# Patient Record
Sex: Male | Born: 1990 | Race: Black or African American | Hispanic: No | Marital: Single | Smoking: Current every day smoker
Health system: Southern US, Community
[De-identification: ages and names within clinical notes are randomized; demographics above are authoritative.]

## PROBLEM LIST (undated history)

## (undated) DIAGNOSIS — M1990 Unspecified osteoarthritis, unspecified site: Secondary | ICD-10-CM

## (undated) DIAGNOSIS — K219 Gastro-esophageal reflux disease without esophagitis: Secondary | ICD-10-CM

## (undated) DIAGNOSIS — I10 Essential (primary) hypertension: Secondary | ICD-10-CM

---
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
## 2010-05-25 NOTE — Letter (Signed)
 Summary: Handout Printed  Printed Handout:  - High Blood Pressure (Hypertension)

## 2010-05-25 NOTE — Assessment & Plan Note (Signed)
 Summary: no show   Allergies: No Known Drug Allergies   Complete Medication List: 1)  Ketoconazole 200 Mg Tabs (Ketoconazole) .SABRA.. 1 tab by mouth daily for 5 days 2)  Ketoconazole 200 Mg Tabs (Ketoconazole) .... 2 tabs by mouth one time

## 2010-05-25 NOTE — Assessment & Plan Note (Signed)
 Summary: penile retraction syndrome,tcb   Vital Signs:  Patient profile:   20 year old male Height:      70 inches Weight:      292.38 pounds BMI:     42.10 Temp:     98.2 degrees F oral Pulse rate:   67 / minute BP sitting:   125 / 83  (left arm)  Vitals Entered By: Arland Morel, CMA (February 10, 2009 11:37 AM) CC: Penile retraction syndrome Is Patient Diabetic? No Pain Assessment Patient in pain? no        Primary Care Provider:  Debby Petties MD  CC:  Penile retraction syndrome.  History of Present Illness: 38y male with penile complaints.  Concerned that his penis is smaller than his brothers and father.  Worried about length.  Able to get normal erections, able to penetrate, uses condoms when having sex, has axillary, beard, and pubic hair, ejaculates normally.  No hx STDs, was tested for GC/Chlam, HIV, HSV, RPR almost a year ago by prior PCP.  Normal urinary stream, no dribbling.  Only complaint is some pulling sensation when erect. No discharge.  Pt's main concern is size of penis.    Habits & Providers  Alcohol-Tobacco-Diet     Tobacco Status: never  Allergies: No Known Drug Allergies  Past History:  Family History: Last updated: 07/07/2006 dad died from copd/osa?  Social History: Last updated: 04/09/2008 lives with mom and brother and sister.  Attends [employer]  Social History: Smoking Status:  never  Review of Systems       See HPI  Physical Exam  General:  Well-developed,well-nourished,in no acute distress; alert,appropriate and cooperative throughout examination Abdomen:  Bowel sounds positive,abdomen soft and non-tender without masses, organomegaly or hernias noted. Genitalia:  Testes bilaterally descended without nodularity, tenderness or masses. No scrotal masses or lesions. No penis lesions or urethral discharge.  Normal secondary sex characteristics, testes normal in size.  Penis 3.5 inches long from base to tip.  Circumsized.   Impression & Recommendations:  Problem # 1:  PENILE PAIN (ICD-607.89) Assessment New Checked first void GC/Chlam, clean catch UA.  UA negative, awaiting Gc/ Chlam.  Tylenol /ibuprofen  for pain.  Penis low-normal range for length but still normal. Prior STD w/u negative.  RTC one month.  Orders: Urinalysis-[facility] (00000) GC/Chlamydia-[facility] (87591/87491) [facility]- Est Level  3 (00786)  Complete Medication List: 1)  Ketoconazole 200 Mg Tabs (Ketoconazole) .SABRA.. 1 tab by mouth daily for 5 days 2)  Ketoconazole 200 Mg Tabs (Ketoconazole) .... 2 tabs by mouth one time  Patient Instructions: 1)  Good to see you, 2)  Your urine test was negative, we still need to wait for container number 2 to be checked at the main lab for GC/ Chlam.  3)  Everything else is normal.  Over the counter tylenol  or ibuprofen  can be used for any penile pain. 4)  Come back to see me in a month to continue preventive health care. 5)  -Dr. ONEIDA.  Laboratory Results   Urine Tests  Date/Time Received: February 10, 2009 12:07 PM  Date/Time Reported: February 10, 2009 12:31 PM   Routine Urinalysis   Color: yellow Appearance: Clear Glucose: negative   (Normal Range: Negative) Bilirubin: negative   (Normal Range: Negative) Ketone: negative   (Normal Range: Negative) Spec. Gravity: 1.020   (Normal Range: 1.003-1.035) Blood: negative   (Normal Range: Negative) pH: 6.5   (Normal Range: 5.0-8.0) Protein: negative   (Normal Range: Negative) Urobilinogen: 0.2   (Normal Range:  0-1) Nitrite: negative   (Normal Range: Negative) Leukocyte Esterace: negative   (Normal Range: Negative)    Comments: ...............test performed by......SABRABonnie A. Jordan, [place] (ASCP)

## 2010-05-25 NOTE — Assessment & Plan Note (Signed)
 Summary: f/u last visit/eo   Vital Signs:  Patient profile:   20 year old male Height:      70 inches Weight:      289.8 pounds BMI:     41.73 Temp:     98.3 degrees F oral Pulse rate:   72 / minute BP sitting:   144 / 85  (right arm) Cuff size:   large  Vitals Entered By: Olam Keeling (March 16, 2009 10:57 AM) CC: F/U apt Is Patient Diabetic? No Pain Assessment Patient in pain? no        Primary Care Provider:  Debby Petties MD  CC:  F/U apt.  History of Present Illness: 20y M here for fu  Penile pain:  Resolved, some stretching sensation when erect, has all secondary sex chars, GC/Chlam, UA neg at last visit.  HTN:  Elevated BP today.  No symptoms.   Skin rash:  states he usually breaks out about this time.  Previously rx'ed ketoconazole and this cleared up rash. KOH positive today.  Preventive care:  Due for Hep B and flu.  Habits & Providers  Alcohol-Tobacco-Diet     Tobacco Status: current     Tobacco Counseling: to quit use of tobacco products     Cigarette Packs/Day: <0.25     Other Tobacco cigar  Current Medications (verified): 1)  Ketoconazole 200 Mg Tabs (Ketoconazole) .SABRA.. 1 Tab By Mouth Daily For 5 Days 2)  Ketoconazole 200 Mg  Tabs (Ketoconazole) .... 2 Tabs By Mouth One Time  Allergies (verified): No Known Drug Allergies  Past History:  Past Medical History: Tinea Corporis  Social History: Smoking Status:  current Packs/Day:  <0.25  Review of Systems       See HPI  Physical Exam  General:  Well-developed,well-nourished,in no acute distress; alert,appropriate and cooperative throughout examination Lungs:  Normal respiratory effort, chest expands symmetrically. Lungs are clear to auscultation, no crackles or wheezes. Heart:  Normal rate and regular rhythm. S1 and S2 normal without gallop, murmur, click, rub or other extra sounds. Skin:  3 circular, scaly lesions present on left side of neck and left shoulder, approx 1cm in  diameter.   Impression & Recommendations:  Problem # 1:  TINEA CORPORIS (ICD-110.5) Assessment New Confirmed on KOH. Refilled ketoconazole.  Pt to RTC if rash flares. Orders: [facility]- Est  Level 4 (00785)  Problem # 2:  PENILE PAIN (ICD-607.89) Assessment: Improved Resolving.  Recommended moisturizers for penile skin.  Orders: [facility]- Est  Level 4 (00785)  Problem # 3:  HYPERTENSION, BENIGN SYSTEMIC (ICD-401.1) Assessment: Deteriorated Care plans printed out, BMET and Lipids.  Orders: [facility]- Est  Level 4 (00785)  Future Orders: Basic Met-[facility] ([id number]) ... 03/16/2010 Lipid-[facility] ([id number]) ... 03/16/2010  Complete Medication List: 1)  Ketoconazole 200 Mg Tabs (Ketoconazole) .SABRA.. 1 tab by mouth daily for 5 days 2)  Ketoconazole 200 Mg Tabs (Ketoconazole) .... 2 tabs by mouth one time  Other Orders: KOH-[facility] (12779)  Patient Instructions: 1)  Take the ketoconazole as prescribed, 2)  Make an appt at the front for a lab visit to check cholesterol and kidney function. 3)  Come back to see me in a year or earlier if needed or if the skin rash flares. 4)  -Dr. ONEIDA. Prescriptions: KETOCONAZOLE 200 MG TABS (KETOCONAZOLE) 1 tab by mouth daily for 5 days  #5 x 0   Entered and Authorized by:   Debby Petties MD   Signed by:   Debby Petties MD on 03/16/2009  Method used:   Print then Give to Patient   RxID:   [id number] KETOCONAZOLE 200 MG  TABS (KETOCONAZOLE) 2 tabs by mouth one time  #2 x 0   Entered and Authorized by:   Debby Petties MD   Signed by:   Debby Petties MD on 03/16/2009   Method used:   Print then Give to Patient   RxID:   [id number]   Flu Vaccine Result Date:  03/16/2009 Flu Vaccine Result:  given Flu Vaccine Next Due:  1 yr TD Next Due:  Not Indicated    Laboratory Results  Date/Time Received: March 16, 2009 11:14 AM  Date/Time Reported: March 16, 2009 11:19 AM   Other Tests  Skin KOH:  Positive Comments: ...............test performed by......SABRABonnie A. Jordan, MLS (ASCP)cm     Prevention & Chronic Care Immunizations   Influenza vaccine: given  (03/16/2009)   Influenza vaccine due: 03/16/2010    Tetanus booster: Not documented   Td booster deferral: Not indicated  (03/16/2009)   Tetanus booster due: Not Indicated    Pneumococcal vaccine: Not documented  Other Screening   Smoking status: current  (03/16/2009)   Smoking cessation counseling: yes  (03/16/2009)  Hypertension   Last Blood Pressure: 144 / 85  (03/16/2009)   Serum creatinine: Not documented   Serum potassium Not documented    Hypertension flowsheet reviewed?: Yes   Progress toward BP goal: Deteriorated  Self-Management Support :   Personal Goals (by the next clinic visit) :      Personal blood pressure goal: 140/90  (03/16/2009)   Hypertension self-management support: Education handout, Pre-printed educational material, Written self-care plan  (03/16/2009)   Hypertension self-care plan printed.   Hypertension education handout printed   Nursing Instructions: Give Flu vaccine today     Appended Document: f/u last visit/eo Flu vaccine & Hept. B vaccine given. Entered in [place]

## 2010-06-09 ENCOUNTER — Ambulatory Visit: Payer: Self-pay | Admitting: Sports Medicine

## 2010-06-13 ENCOUNTER — Encounter: Payer: Self-pay | Admitting: *Deleted

## 2011-08-30 ENCOUNTER — Emergency Department (HOSPITAL_COMMUNITY)
Admission: EM | Admit: 2011-08-30 | Discharge: 2011-08-30 | Disposition: A | Discharge status: Home / Self Care | Payer: Self-pay | Attending: Emergency Medicine | Admitting: Emergency Medicine

## 2011-08-30 ENCOUNTER — Encounter (HOSPITAL_COMMUNITY): Payer: Self-pay | Admitting: *Deleted

## 2011-08-30 DIAGNOSIS — R51 Headache: Secondary | ICD-10-CM | POA: Insufficient documentation

## 2011-08-30 DIAGNOSIS — E119 Type 2 diabetes mellitus without complications: Secondary | ICD-10-CM | POA: Insufficient documentation

## 2011-08-30 NOTE — ED Provider Notes (Signed)
History  This chart was scribed for Dione Booze, MD by Bennett Scrape. This patient was seen in room STRE5/STRE5 and the patient's care was started at 7:09PM.  CSN: [id number]  Arrival date & time 08/30/11  1617   First MD Initiated Contact with Patient 08/30/11 1909      Chief Complaint  Patient presents with  . Facial Pain     The history is provided by the patient. No language interpreter was used.    Hayden Moore is a 21 y.o. male who presents to the Emergency Department complaining of gradual onset, gradually improving right-sided facial pain described as a pounding/throubbing sensation that started about 5 hours ago. He denies pain now. The pain is a 9 out of 10 at the worst. He reports taking two 800 mg ibuprofen pills with moderate improvement in pain. He denies injury as the cuase. He denies prior episodes. He denies any associated symptoms such as trouble breathing, HA or fever. He has a h/o diabetes.  No PCP.  Past Medical History  Diagnosis Date  . Diabetes mellitus     History reviewed. No pertinent past surgical history.  No family history on file.  History  Substance Use Topics  . Smoking status: Never Smoker   . Smokeless tobacco: Not on file  . Alcohol Use: Yes      Review of Systems  Constitutional: Negative for fever and chills.  HENT: Negative for congestion and trouble swallowing.        Right-sided facial pain  Respiratory: Negative for cough and shortness of breath.   Gastrointestinal: Negative for nausea and vomiting.  Neurological: Negative for weakness and headaches.    Allergies  Review of patient's allergies indicates no known allergies.  Home Medications  No current outpatient prescriptions on file.  Triage Vitals: BP 159/67  Pulse 57  Temp(Src) 98.4 F (36.9 C) (Oral)  Resp 20  SpO2 100%  Physical Exam  Nursing note and vitals reviewed. Constitutional: He is oriented to person, place, and time. He appears well-developed  and well-nourished. No distress.  HENT:  Head: Normocephalic and atraumatic.       TMs are clear bilaterally  Eyes: Conjunctivae and EOM are normal.  Neck: Neck supple. No tracheal deviation present.  Cardiovascular: Normal rate.   Pulmonary/Chest: Effort normal. No respiratory distress.  Musculoskeletal: Normal range of motion.  Lymphadenopathy:    He has no cervical adenopathy.  Neurological: He is alert and oriented to person, place, and time.  Skin: Skin is warm and dry.  Psychiatric: He has a normal mood and affect. His behavior is normal.    ED Course  Procedures (including critical care time)  DIAGNOSTIC STUDIES: Oxygen Saturation is 100% on room air, normal by my interpretation.    COORDINATION OF CARE: 7:13PM-Advised pt to not take 800 mg of ibuprofen at one time due to risk of ulcers.  7:15PM-Discussed discharge plans with pt and pt agreed. Will refer pt to neurologist to follow up with if the pain returns.    1. Right-sided face pain       MDM  Facial pain of uncertain etiology. Possible trigeminal neuralgia.  I personally performed the services described in this documentation, which was scribed in my presence. The recorded information has been reviewed and considered.          Dione Booze, MD 09/04/11 [phone]

## 2011-08-30 NOTE — Discharge Instructions (Signed)
Take ibuprofen or naproxen as needed for pain. If pain starts coming more frequently, followup with a neurologist for further evaluation. Return to the emergency department if you're having any problems.  Trigeminal Neuralgia Trigeminal neuralgia is a nerve disorder that causes sudden attacks of severe facial pain. It is caused by damage to the trigeminal nerve, a major nerve in the face. It is more common in women and in the elderly, although it can also happen in younger patients. Attacks last from a few seconds to several minutes and can occur from a couple of times per year to several times per day. Trigeminal neuralgia can be a very distressing and disabling condition. Surgery may be needed in very severe cases if medical treatment does not give relief. HOME CARE INSTRUCTIONS   If your caregiver prescribed medication to help prevent attacks, take as directed.   To help prevent attacks:   Chew on the unaffected side of the mouth.   Avoid touching your face.   Avoid blasts of hot or cold air.   Men may wish to grow a beard to avoid having to shave.  SEEK IMMEDIATE MEDICAL CARE IF:  Pain is unbearable and your medicine does not help.   You develop new, unexplained symptoms (problems).   You have problems that may be related to a medication you are taking.  Document Released: 04/08/2000 Document Revised: 03/31/2011 Document Reviewed: 02/06/2009 [facility] Patient Information 2012 [place], [place].

## 2011-08-30 NOTE — ED Notes (Signed)
The pt has had rt face  Head pain for 2hours while watching tv.  No known injury, no previous history.  He took ibu and the pain is half what it was

## 2013-01-29 ENCOUNTER — Ambulatory Visit: Payer: Self-pay | Attending: Internal Medicine

## 2013-02-13 ENCOUNTER — Ambulatory Visit: Payer: Self-pay

## 2016-07-28 ENCOUNTER — Encounter (HOSPITAL_COMMUNITY): Payer: Self-pay | Admitting: Emergency Medicine

## 2016-07-28 ENCOUNTER — Emergency Department (HOSPITAL_COMMUNITY)
Admission: EM | Admit: 2016-07-28 | Discharge: 2016-07-28 | Disposition: A | Discharge status: Home / Self Care | Payer: Self-pay | Attending: Emergency Medicine | Admitting: Emergency Medicine

## 2016-07-28 DIAGNOSIS — Y999 Unspecified external cause status: Secondary | ICD-10-CM | POA: Insufficient documentation

## 2016-07-28 DIAGNOSIS — Y939 Activity, unspecified: Secondary | ICD-10-CM | POA: Insufficient documentation

## 2016-07-28 DIAGNOSIS — I10 Essential (primary) hypertension: Secondary | ICD-10-CM | POA: Insufficient documentation

## 2016-07-28 DIAGNOSIS — Y929 Unspecified place or not applicable: Secondary | ICD-10-CM | POA: Insufficient documentation

## 2016-07-28 DIAGNOSIS — E119 Type 2 diabetes mellitus without complications: Secondary | ICD-10-CM | POA: Insufficient documentation

## 2016-07-28 DIAGNOSIS — X58XXXA Exposure to other specified factors, initial encounter: Secondary | ICD-10-CM | POA: Insufficient documentation

## 2016-07-28 DIAGNOSIS — S39012A Strain of muscle, fascia and tendon of lower back, initial encounter: Secondary | ICD-10-CM | POA: Insufficient documentation

## 2016-07-28 MED ORDER — METHOCARBAMOL 500 MG PO TABS
1000.0000 mg | ORAL_TABLET | Freq: Once | ORAL | Status: AC
  Administered 2016-07-28: 1000 mg via ORAL
  Filled 2016-07-28: qty 2

## 2016-07-28 MED ORDER — KETOROLAC TROMETHAMINE 60 MG/2ML IM SOLN
60.0000 mg | Freq: Once | INTRAMUSCULAR | Status: AC
  Administered 2016-07-28: 60 mg via INTRAMUSCULAR
  Filled 2016-07-28: qty 2

## 2016-07-28 MED ORDER — NAPROXEN 500 MG PO TABS: 500.0000 mg | ORAL_TABLET | Freq: Two times a day (BID) | ORAL | 0 refills | Status: DC

## 2016-07-28 MED ORDER — METHOCARBAMOL 500 MG PO TABS: 1000.0000 mg | ORAL_TABLET | Freq: Two times a day (BID) | ORAL | 0 refills | Status: DC

## 2016-07-28 NOTE — ED Triage Notes (Signed)
Pt c/o lower back pain onset 2 days ago. Pt handles granite countertops for work and reports pain worsening over past 2 days. Denies any injury. Pt ambulatory.

## 2016-07-28 NOTE — ED Provider Notes (Signed)
[facility]-EMERGENCY DEPT Provider Note   CSN: [id number] Arrival date & time: 07/28/16  2244     History   Chief Complaint Chief Complaint  Patient presents with  . Back Pain    HPI Hayden Moore is a 26 y.o. male.  HPI Hayden Moore is a 26 y.o. male with history of diabetes, presents to emergency department with complaint of back pain. Patient states that he developed back pain approximately 2 days ago. He states he remembers lifting a heavy countertop at work when he started feeling the pain. Pain is in the lower back, bilateral, does not radiate to extremities. Pain is worsened with movement. Denies numbness or weakness in extremities. Denies any trouble controlling his bowels or bladder. Denies any abdominal pain. Denies history of back issues in the past. Patient is ambulatory. Took Advil yesterday, did not take anything for pain today.  Past Medical History:  Diagnosis Date  . Diabetes mellitus     Patient Active Problem List   Diagnosis Date Noted  . TOBACCO USER 03/17/2009  . TINEA CORPORIS 03/16/2009  . PENILE PAIN 02/10/2009  . OBESITY 04/09/2008  . TINEA VERSICOLOR 02/12/2007  . HYPERTENSION, BENIGN SYSTEMIC 06/22/2006    History reviewed. No pertinent surgical history.     Home Medications    Prior to Admission medications   Not on File    Family History No family history on file.  Social History Social History  Substance Use Topics  . Smoking status: Never Smoker  . Smokeless tobacco: Not on file  . Alcohol use Yes     Allergies   Patient has no known allergies.   Review of Systems Review of Systems  Constitutional: Negative for chills and fever.  Respiratory: Negative for cough, chest tightness and shortness of breath.   Cardiovascular: Negative for chest pain, palpitations and leg swelling.  Gastrointestinal: Negative for abdominal distention, abdominal pain, diarrhea, nausea and vomiting.  Genitourinary: Negative for dysuria, frequency,  hematuria and urgency.  Musculoskeletal: Positive for arthralgias and back pain. Negative for myalgias, neck pain and neck stiffness.  Skin: Negative for rash.  Allergic/Immunologic: Negative for immunocompromised state.  Neurological: Negative for dizziness, weakness, light-headedness, numbness and headaches.  All other systems reviewed and are negative.    Physical Exam Updated Vital Signs BP (!) 153/77 (BP Location: Left Arm)   Pulse 94   Temp 98.4 F (36.9 C) (Oral)   Resp 18   Ht 5' 11.5" (1.816 m)   Wt 120.7 kg   SpO2 100%   BMI 36.58 kg/m   Physical Exam  Constitutional: He is oriented to person, place, and time. He appears well-developed and well-nourished. No distress.  HENT:  Head: Normocephalic and atraumatic.  Eyes: Conjunctivae are normal.  Neck: Neck supple.  Cardiovascular: Normal rate, regular rhythm and normal heart sounds.   Pulmonary/Chest: Effort normal. No respiratory distress. He has no wheezes. He has no rales.  Musculoskeletal: He exhibits no edema.  No thoracic or lumbar spine midline tenderness. Tenderness to palpation over bilateral paraspinal muscles of the lumbar spine. No pain with bilateral straight leg raise. Pain with any lumbar flexion or extension  Neurological: He is alert and oriented to person, place, and time.  5/5 and equal lower extremity strength. 2+ and equal patellar reflexes bilaterally. Pt able to dorsiflex bilateral toes and feet with good strength against resistance. Equal sensation bilaterally over thighs and lower legs.   Skin: Skin is warm and dry.  Nursing note and vitals reviewed.  ED Treatments / Results  Labs (all labs ordered are listed, but only abnormal results are displayed) Labs Reviewed - No data to display  EKG  EKG Interpretation None       Radiology No results found.  Procedures Procedures (including critical care time)  Medications Ordered in ED Medications  ketorolac (TORADOL) injection 60  mg (not administered)  methocarbamol (ROBAXIN) tablet 1,000 mg (not administered)     Initial Impression / Assessment and Plan / ED Course  I have reviewed the triage vital signs and the nursing notes.  Pertinent labs & imaging results that were available during my care of the patient were reviewed by me and considered in my medical decision making (see chart for details).     Patient with lower back pain after lifting heavy granite countertop. He has no history of back issues. His exam and history does not suggest cauda equina. His pain is more muscular. Most likely muscular strain or spasms. Will treat with NSAIDs and Robaxin. Given Toradol and Robaxin in emergency department. We'll discharge home with naproxen and Robaxin. Follow-up with family doctor.  Vitals:   07/28/16 2251 07/28/16 2252  BP: (!) 153/77   Pulse: 94   Resp: 18   Temp: 98.4 F (36.9 C)   TempSrc: Oral   SpO2: 100%   Weight:  120.7 kg  Height:  5' 11.5" (1.816 m)     Final Clinical Impressions(s) / ED Diagnoses   Final diagnoses:  Strain of lumbar region, initial encounter    New Prescriptions New Prescriptions   METHOCARBAMOL (ROBAXIN) 500 MG TABLET    Take 2 tablets (1,000 mg total) by mouth 2 (two) times daily.   NAPROXEN (NAPROSYN) 500 MG TABLET    Take 1 tablet (500 mg total) by mouth 2 (two) times daily.     Jaynie Crumble, PA-C 07/28/16 2331    Arby Barrette, MD 08/04/16 2039

## 2016-07-28 NOTE — Discharge Instructions (Signed)
Try heating pads and stretches. Take naprosyn as prescribed for pain and inflammation. Take Robaxin for muscle spasms. Follow-up with family doctor if not improving. See attached back exercises to strengthen the muscles in her back.

## 2016-08-04 ENCOUNTER — Emergency Department (HOSPITAL_COMMUNITY)
Admission: EM | Admit: 2016-08-04 | Discharge: 2016-08-04 | Disposition: A | Discharge status: Home / Self Care | Payer: Self-pay | Attending: Emergency Medicine | Admitting: Emergency Medicine

## 2016-08-04 DIAGNOSIS — Y999 Unspecified external cause status: Secondary | ICD-10-CM | POA: Insufficient documentation

## 2016-08-04 DIAGNOSIS — M545 Low back pain: Secondary | ICD-10-CM | POA: Insufficient documentation

## 2016-08-04 DIAGNOSIS — X500XXA Overexertion from strenuous movement or load, initial encounter: Secondary | ICD-10-CM | POA: Insufficient documentation

## 2016-08-04 DIAGNOSIS — Y929 Unspecified place or not applicable: Secondary | ICD-10-CM | POA: Insufficient documentation

## 2016-08-04 DIAGNOSIS — E119 Type 2 diabetes mellitus without complications: Secondary | ICD-10-CM | POA: Insufficient documentation

## 2016-08-04 DIAGNOSIS — Y9389 Activity, other specified: Secondary | ICD-10-CM | POA: Insufficient documentation

## 2016-08-04 MED ORDER — KETOROLAC TROMETHAMINE 60 MG/2ML IM SOLN
60.0000 mg | Freq: Once | INTRAMUSCULAR | Status: AC
  Administered 2016-08-04: 60 mg via INTRAMUSCULAR
  Filled 2016-08-04: qty 2

## 2016-08-04 NOTE — ED Notes (Signed)
Pt reports he was seen in the ED for same last week.  Pain is worsening.  States the pain started to radiate to the L side of his back.  Pt ambulatory without difficulty.

## 2016-08-04 NOTE — ED Triage Notes (Signed)
Pt c/o back pain. Was in ED 1 week ago, given muscle relaxer and Tylenol, no relief. Pain moved from medial back to left lateral back near hip, worse with ambulation. No bowel or bladder incontinence.

## 2016-08-04 NOTE — Discharge Instructions (Signed)
Please take ibuprofen or naproxen as needed for pain. Use warm compress to the area every day. Stretch lower back and perform low back exercises. Follow up with a primary care provider using the attached financial resource guide.  Get help right away if: You develop new bowel or bladder control problems. You have unusual weakness or numbness in your arms or legs. You develop nausea or vomiting. You develop abdominal pain. You feel faint.

## 2016-08-04 NOTE — ED Provider Notes (Signed)
[facility]-EMERGENCY DEPT Provider Note   CSN: [id number] Arrival date & time: 08/04/16  1416  By signing my name below, I, Teofilo Pod, attest that this documentation has been prepared under the direction and in the presence of Lorretta Harp, [place]. Electronically Signed: Teofilo Pod, ED Scribe. 08/04/2016. 3:57 PM.    History   Chief Complaint Chief Complaint  Patient presents with  . Back Pain    The history is provided by the patient. No language interpreter was used.   HPI Comments:  Hayden Moore is a 26 y.o. male with PMHx of DM who presents to the Emergency Department complaining of constant back pain x 1 week. He states that the pain has moved from the center of his back to the left lower side of the back, radiating down the left leg. Pt was seen here 1 week ago for the same after lifting a heavy counter top at work. He was treated with toradol, naproxen and Robaxin with no relief for pain. He states that the pain is worse with moving, BMs and laughing. Denies any previous similar pain, and denies any other injuries or IV drug use. No alleviating factors noted. He denies dysuria, frequency, urgency. Denies fever, chills, nausea, vomiting, visual changes, abdominal pain.    Past Medical History:  Diagnosis Date  . Diabetes mellitus     Patient Active Problem List   Diagnosis Date Noted  . TOBACCO USER 03/17/2009  . TINEA CORPORIS 03/16/2009  . PENILE PAIN 02/10/2009  . OBESITY 04/09/2008  . TINEA VERSICOLOR 02/12/2007  . HYPERTENSION, BENIGN SYSTEMIC 06/22/2006    No past surgical history on file.     Home Medications    Prior to Admission medications   Medication Sig Start Date End Date Taking? Authorizing Provider  methocarbamol (ROBAXIN) 500 MG tablet Take 2 tablets (1,000 mg total) by mouth 2 (two) times daily. 07/28/16   Tatyana Kirichenko, PA-C  naproxen (NAPROSYN) 500 MG tablet Take 1 tablet (500 mg total) by mouth 2 (two) times daily. 07/28/16    Jaynie Crumble, PA-C    Family History No family history on file.  Social History Social History  Substance Use Topics  . Smoking status: Never Smoker  . Smokeless tobacco: Not on file  . Alcohol use Yes     Allergies   Patient has no known allergies.   Review of Systems Review of Systems  Constitutional: Negative for chills and fever.  Eyes: Negative for visual disturbance.  Gastrointestinal: Negative for nausea and vomiting.  Musculoskeletal: Positive for back pain and myalgias.  Neurological: Negative for numbness.     Physical Exam Updated Vital Signs BP (!) 143/77 (BP Location: Left Arm)   Pulse 87   Temp 99.9 F (37.7 C) (Oral)   Resp 18   Ht 5' 11.5" (1.816 m)   Wt 125.7 kg   SpO2 97%   BMI 38.11 kg/m   Physical Exam  Constitutional: He is oriented to person, place, and time. He appears well-developed and well-nourished.  Well appearing  HENT:  Head: Normocephalic and atraumatic.  Nose: Nose normal.  Mouth/Throat: Oropharynx is clear and moist.  Eyes: Conjunctivae and EOM are normal. Pupils are equal, round, and reactive to light.  Neck: Normal range of motion.  Normal ROM, no neck tenderness. No nuchal rigidity  Cardiovascular: Normal rate, normal heart sounds and intact distal pulses.   No murmur heard. 2+ distal pulses bilaterally  Pulmonary/Chest: Effort normal and breath sounds normal. No respiratory distress. He  has no wheezes. He has no rales.  Normal work of breathing. No respiratory distress noted.   Abdominal: Soft. Bowel sounds are normal. There is no tenderness. There is no rebound and no guarding.  Soft and nontender. No rebound or guarding. No pulsatile mass noted.   Musculoskeletal: Normal range of motion. He exhibits tenderness. He exhibits no deformity.  There is mild tenderness to midline lumbar spine and maximally tender at lower left back back. No midline cervical, thoracic. Good ROM of spine. No deformity. No obvious wound,  redness, or swelling noted. No stepdown deformity.  Neurological: He is alert and oriented to person, place, and time.  Cranial Nerves:  III,IV, VI: ptosis not present, extra-ocular movements intact bilaterally, direct and consensual pupillary light reflexes intact bilaterally V: facial sensation, jaw opening, and bite strength equal bilaterally VII: eyebrow raise, eyelid close, smile, frown, pucker equal bilaterally VIII: hearing grossly normal bilaterally  IX,X: palate elevation and swallowing intact XI: bilateral shoulder shrug and lateral head rotation equal and strong XII: midline tongue extension  Negative pronator drift, negative Romberg, negative RAM's, negative heel-to-shin, negative finger to nose.    Sensory intact.  Muscle strength 5/5 Patient able to ambulate without difficulty.   SLR Right -  negative SLR Left -  negative  Skin: Skin is warm. Capillary refill takes less than 2 seconds.  Psychiatric: He has a normal mood and affect. His behavior is normal.  Nursing note and vitals reviewed.    ED Treatments / Results  DIAGNOSTIC STUDIES:  Oxygen Saturation is 97% on RA, normal by my interpretation.    COORDINATION OF CARE:  3:54 PM Discussed treatment plan with pt at bedside and pt agreed to plan.   Labs (all labs ordered are listed, but only abnormal results are displayed) Labs Reviewed - No data to display  EKG  EKG Interpretation None       Radiology No results found.  Procedures Procedures (including critical care time)  Medications Ordered in ED Medications  ketorolac (TORADOL) injection 60 mg (60 mg Intramuscular Given 08/04/16 1613)     Initial Impression / Assessment and Plan / ED Course  I have reviewed the triage vital signs and the nursing notes.  Pertinent labs & imaging results that were available during my care of the patient were reviewed by me and considered in my medical decision making (see chart for details).    Patient with  back pain.  No neurological deficits and normal neuro exam.  Patient is ambulatory.  No loss of bowel or bladder control.  No concern for cauda equina.  No fever, night sweats, weight loss, h/o cancer, IVDA, no recent procedure to back. No urinary symptoms suggestive of UTI.  Supportive care and return precaution discussed. Appears safe for discharge at this time. Follow up as indicated in discharge paperwork.    Final Clinical Impressions(s) / ED Diagnoses   Final diagnoses:  Acute left-sided low back pain, with sciatica presence unspecified    New Prescriptions New Prescriptions   No medications on file  I personally performed the services described in this documentation, which was scribed in my presence. The recorded information has been reviewed and is accurate.    [address] [place], [place] 08/04/16 1629    Maia Plan, MD 08/05/16 1016

## 2016-09-09 DIAGNOSIS — I10 Essential (primary) hypertension: Secondary | ICD-10-CM | POA: Insufficient documentation

## 2016-09-09 DIAGNOSIS — Y999 Unspecified external cause status: Secondary | ICD-10-CM | POA: Insufficient documentation

## 2016-09-09 DIAGNOSIS — Z79899 Other long term (current) drug therapy: Secondary | ICD-10-CM | POA: Insufficient documentation

## 2016-09-09 DIAGNOSIS — X500XXA Overexertion from strenuous movement or load, initial encounter: Secondary | ICD-10-CM | POA: Insufficient documentation

## 2016-09-09 DIAGNOSIS — M25552 Pain in left hip: Secondary | ICD-10-CM | POA: Insufficient documentation

## 2016-09-09 DIAGNOSIS — Y929 Unspecified place or not applicable: Secondary | ICD-10-CM | POA: Insufficient documentation

## 2016-09-09 DIAGNOSIS — Y939 Activity, unspecified: Secondary | ICD-10-CM | POA: Insufficient documentation

## 2016-09-09 DIAGNOSIS — E119 Type 2 diabetes mellitus without complications: Secondary | ICD-10-CM | POA: Insufficient documentation

## 2016-09-10 ENCOUNTER — Emergency Department (HOSPITAL_COMMUNITY): Payer: Self-pay

## 2016-09-10 ENCOUNTER — Encounter (HOSPITAL_COMMUNITY): Payer: Self-pay | Admitting: Nurse Practitioner

## 2016-09-10 ENCOUNTER — Emergency Department (HOSPITAL_COMMUNITY)
Admission: EM | Admit: 2016-09-10 | Discharge: 2016-09-10 | Disposition: A | Discharge status: Home / Self Care | Payer: Self-pay | Attending: Emergency Medicine | Admitting: Emergency Medicine

## 2016-09-10 DIAGNOSIS — M25552 Pain in left hip: Secondary | ICD-10-CM

## 2016-09-10 NOTE — ED Triage Notes (Signed)
Pt is c/o left hip pain that he states originated from his back that he was evaluated for last week. Rating his pain 7/10.

## 2016-09-10 NOTE — ED Provider Notes (Signed)
[facility]-EMER[facility]Y DEPT Provider Note   CSN: [id number] Arrival date & time: 09/09/16  2358  By signing my name below, I, Hayden Moore, attest that this documentation has been prepared under the direction and in the presence of non-physician practitioner, Eyvonne Mechanic, PA-C. Electronically Signed: Modena Moore, Scribe. 09/10/2016. 12:58 AM.  History   Chief Complaint Chief Complaint  Patient presents with  . Hip Pain    Left hip   The history is provided by the patient. No language interpreter was used.   HPI Comments: Hayden Moore is a 26 y.o. male with a PMHx of DM who presents to the Emergency Department complaining of constant moderate left hip pain that started less than a week ago. He was seen in the ED on 08/04/16 for back pain. He states he has a hx of back strain from his job and now his pain has moved to his left hip. No recent trauma. No treatment PTA. His pain is exacerbated by ambulation. Denies any fever, chills, vomiting, or other complaints at this time.  Past Medical History:  Diagnosis Date  . Diabetes mellitus     Patient Active Problem List   Diagnosis Date Noted  . TOBACCO USER 03/17/2009  . TINEA CORPORIS 03/16/2009  . PENILE PAIN 02/10/2009  . OBESITY 04/09/2008  . TINEA VERSICOLOR 02/12/2007  . HYPERTENSION, BENIGN SYSTEMIC 06/22/2006    History reviewed. No pertinent surgical history.     Home Medications    Prior to Admission medications   Medication Sig Start Date End Date Taking? Authorizing Provider  methocarbamol (ROBAXIN) 500 MG tablet Take 2 tablets (1,000 mg total) by mouth 2 (two) times daily. 07/28/16   Kirichenko, Tatyana, PA-C  naproxen (NAPROSYN) 500 MG tablet Take 1 tablet (500 mg total) by mouth 2 (two) times daily. 07/28/16   Jaynie Crumble, PA-C    Family History History reviewed. No pertinent family history.  Social History Social History  Substance Use Topics  . Smoking status: Never Smoker  . Smokeless tobacco: Not on  file  . Alcohol use Yes     Allergies   Patient has no known allergies.   Review of Systems Review of Systems  Constitutional: Negative for chills and fever.  Gastrointestinal: Negative for vomiting.  Musculoskeletal: Positive for myalgias (Left hip).     Physical Exam Updated Vital Signs BP (!) 144/85 (BP Location: Right Arm)   Temp 98.3 F (36.8 C) (Oral)   Resp 18   Ht 5\' 11"  (1.803 m)   Wt 277 lb (125.6 kg)   SpO2 95%   BMI 38.63 kg/m   Physical Exam  Constitutional: He appears well-developed and well-nourished. No distress.  HENT:  Head: Normocephalic and atraumatic.  Eyes: Conjunctivae are normal.  Neck: Neck supple.  Cardiovascular: Normal rate.   Pulmonary/Chest: Effort normal.  Abdominal: Soft.  Musculoskeletal: Normal range of motion.  Left hip is non TTP. Full active ROM. Pain with flexion both passive and active. No warmth to touch. Distal sensation and motor function intact.   Neurological: He is alert.  Skin: Skin is warm and dry.  Psychiatric: He has a normal mood and affect.  Nursing note and vitals reviewed.    ED Treatments / Results  DIAGNOSTIC STUDIES: Oxygen Saturation is 95% on RA, normal by my interpretation.    COORDINATION OF CARE: 1:02 AM- Pt advised of plan for treatment and pt agrees.  Labs (all labs ordered are listed, but only abnormal results are displayed) Labs Reviewed - No data to  display  EKG  EKG Interpretation None       Radiology Dg Hip Unilat W Or Wo Pelvis 2-3 Views Left  Result Date: 09/10/2016 CLINICAL DATA:  Left hip pain, onset last week after lifting something heavy. EXAM: DG HIP (WITH OR WITHOUT PELVIS) 2-3V LEFT COMPARISON:  None. FINDINGS: The cortical margins of the bony pelvis and left hip are intact. No fracture. Pubic symphysis and sacroiliac joints are congruent. Both femoral heads are well-seated in the respective acetabula. IMPRESSION: Negative radiographs of the pelvis and left hip.  Electronically Signed   By: Rubye OaksMelanie  Ehinger M.D.   On: 09/10/2016 01:41    Procedures Procedures (including critical care time)  Medications Ordered in ED Medications - No data to display   Initial Impression / Assessment and Plan / ED Course  I have reviewed the triage vital signs and the nursing notes.  Pertinent labs & imaging results that were available during my care of the patient were reviewed by me and considered in my medical decision making (see chart for details).      Final Clinical Impressions(s) / ED Diagnoses   Final diagnoses:  Pain of left hip joint    Left hip pain. This is atraumatic plain films no signs of infection. Symptomatic care instructions given, follow-up information given. She  New Prescriptions Discharge Medication List as of 09/10/2016  2:06 AM     I personally performed the services described in this documentation, which was scribed in my presence. The recorded information has been reviewed and is accurate.    Eyvonne MechanicHedges, Lilli Dewald, PA-C 09/10/16 [id number]    Ward, Layla MawKristen N, DO 09/10/16 [phone]

## 2016-09-10 NOTE — ED Notes (Signed)
Pt c/o pain in his left hip, and that it feels "like his ligaments are wearing out." He stated he wanted an x-ray to check it out.

## 2016-09-10 NOTE — Discharge Instructions (Signed)
Please read attached information. If you experience any new or worsening signs or symptoms please return to the emergency room for evaluation. Please follow-up with your primary care provider or specialist as discussed.  °

## 2016-09-10 NOTE — ED Notes (Signed)
Patient states he hurt his back a month ago at work. He says his back pain is healed but it feels like it migrated to his hip. Patient did not have any injury to hip.

## 2017-01-24 ENCOUNTER — Emergency Department (HOSPITAL_COMMUNITY)
Admission: EM | Admit: 2017-01-24 | Discharge: 2017-01-24 | Disposition: A | Discharge status: Home / Self Care | Payer: Self-pay | Attending: Emergency Medicine | Admitting: Emergency Medicine

## 2017-01-24 ENCOUNTER — Encounter (HOSPITAL_COMMUNITY): Payer: Self-pay | Admitting: Emergency Medicine

## 2017-01-24 DIAGNOSIS — E119 Type 2 diabetes mellitus without complications: Secondary | ICD-10-CM | POA: Insufficient documentation

## 2017-01-24 DIAGNOSIS — I10 Essential (primary) hypertension: Secondary | ICD-10-CM | POA: Insufficient documentation

## 2017-01-24 DIAGNOSIS — R300 Dysuria: Secondary | ICD-10-CM | POA: Insufficient documentation

## 2017-01-24 DIAGNOSIS — Z202 Contact with and (suspected) exposure to infections with a predominantly sexual mode of transmission: Secondary | ICD-10-CM | POA: Insufficient documentation

## 2017-01-24 DIAGNOSIS — R309 Painful micturition, unspecified: Secondary | ICD-10-CM | POA: Insufficient documentation

## 2017-01-24 LAB — RAPID HIV SCREEN (HIV 1/2 AB+AG)
HIV 1/2 Antibodies: NONREACTIVE
HIV-1 P24 Antigen - HIV24: NONREACTIVE

## 2017-01-24 LAB — URINALYSIS, ROUTINE W REFLEX MICROSCOPIC
BILIRUBIN URINE: NEGATIVE
Glucose, UA: NEGATIVE mg/dL
Hgb urine dipstick: NEGATIVE
KETONES UR: NEGATIVE mg/dL
LEUKOCYTES UA: NEGATIVE
NITRITE: NEGATIVE
PROTEIN: NEGATIVE mg/dL
Specific Gravity, Urine: 1.004 — ABNORMAL LOW (ref 1.005–1.030)
pH: 5 (ref 5.0–8.0)

## 2017-01-24 MED ORDER — LIDOCAINE HCL 1 % IJ SOLN
INTRAMUSCULAR | Status: AC
  Administered 2017-01-24: 1.5 mL
  Filled 2017-01-24: qty 20

## 2017-01-24 MED ORDER — CEFTRIAXONE SODIUM 250 MG IJ SOLR
250.0000 mg | Freq: Once | INTRAMUSCULAR | Status: AC
  Administered 2017-01-24: 250 mg via INTRAMUSCULAR
  Filled 2017-01-24: qty 250

## 2017-01-24 MED ORDER — AZITHROMYCIN 250 MG PO TABS
1000.0000 mg | ORAL_TABLET | Freq: Once | ORAL | Status: AC
  Administered 2017-01-24: 1000 mg via ORAL
  Filled 2017-01-24: qty 4

## 2017-01-24 NOTE — Discharge Instructions (Addendum)
You have been treated for gonorrhea and Chlamydia in the emergency department today. You will get a phone call if your cultures for chlamydia and gonorrhea are positive. You also have syphilis and HIV blood work testing which is pending. You will get a call if your syphilis test is positive. You will need to call for HIV results or check online in your mychart account.   Please schedule an appointment for [facility] for recheck of your blood pressure which was elevated today in the ER. The patient states he did not have insurance.  Please return to the emergency department if you develop worsening abdominal pain, fever, or any new or concerning symptoms.

## 2017-01-24 NOTE — ED Triage Notes (Signed)
Pt stated that his sexual partner was recently treated for a positive STD

## 2017-01-24 NOTE — ED Triage Notes (Signed)
Patient here reporting that he needs a "check up". States that he is sexually active and needs to "be sure". No pain.

## 2017-01-24 NOTE — ED Provider Notes (Signed)
[facility]-EMER[facility] DEPT Provider Note   CSN: [id number] Arrival date & time: 01/24/17  1133     History   Chief Complaint Chief Complaint  Patient presents with  . Exposure to STD    HPI Hayden Moore is a 26 y.o. male.  HPI   Hayden Moore Is a 26 year old male with a history of hypertension (not currently on medication) who presents the emergency department for evaluation of "I want to get checked out for STDs." Patient states that he was told by his girlfriend today that she had a "bacterial STD." Unclear what infection she was diagnosed with. Patient also states that for the past 3 weeks he has had burning with urination, and feeling of pressure when he urinates. Denies abdominal pain, fever, pain with bowel movements, testicular pain, penile pain, penile discharge. He states that he is actually active with one male partner for the past 6 months, does not consistently use condoms.  Past Medical History:  Diagnosis Date  . Diabetes mellitus     Patient Active Problem List   Diagnosis Date Noted  . TOBACCO USER 03/17/2009  . TINEA CORPORIS 03/16/2009  . PENILE PAIN 02/10/2009  . OBESITY 04/09/2008  . TINEA VERSICOLOR 02/12/2007  . HYPERTENSION, BENIGN SYSTEMIC 06/22/2006    History reviewed. No pertinent surgical history.     Home Medications    Prior to Admission medications   Not on File    Family History No family history on file.  Social History Social History  Substance Use Topics  . Smoking status: Never Smoker  . Smokeless tobacco: Never Used  . Alcohol use Yes     Allergies   Patient has no known allergies.   Review of Systems Review of Systems  Constitutional: Negative for chills, fatigue and fever.  Gastrointestinal: Negative for abdominal pain, diarrhea, nausea and vomiting.  Genitourinary: Positive for dysuria. Negative for difficulty urinating, discharge, flank pain, frequency, hematuria, penile pain, penile swelling, scrotal swelling,  testicular pain and urgency.  Musculoskeletal: Negative for arthralgias, back pain and myalgias.  Skin: Negative for rash and wound.     Physical Exam Updated Vital Signs BP (!) 174/99 (BP Location: Right Arm)   Pulse 91   Temp 98.6 F (37 C) (Oral)   Resp 18   SpO2 99%   Physical Exam  Constitutional: He is oriented to person, place, and time. He appears well-developed and well-nourished. No distress.  HENT:  Head: Normocephalic and atraumatic.  Eyes: Right eye exhibits no discharge. Left eye exhibits no discharge.  Pulmonary/Chest: Effort normal. No respiratory distress.  Abdominal: Soft. Bowel sounds are normal. There is no tenderness. There is no guarding.  Musculoskeletal:  Chaperone present for exam. No discharge from penis. No signs of lesion or erythema on the penis or testicles. The penis and testicles are nontender. No testicular masses or swelling. No signs of any inguinal hernias.   Neurological: He is alert and oriented to person, place, and time. Coordination normal.  Skin: Skin is warm and dry. Capillary refill takes less than 2 seconds. No rash noted. He is not diaphoretic. No erythema.  Psychiatric: He has a normal mood and affect. His behavior is normal.  Nursing note and vitals reviewed.    ED Treatments / Results  Labs (all labs ordered are listed, but only abnormal results are displayed) Labs Reviewed  URINALYSIS, ROUTINE W REFLEX MICROSCOPIC - Abnormal; Notable for the following:       Result Value   Color, Urine STRAW (*)  Specific Gravity, Urine 1.004 (*)    All other components within normal limits  RAPID HIV SCREEN (HIV 1/2 AB+AG)  RPR  GC/CHLAMYDIA PROBE AMP ([place]) NOT AT [facility]    EKG  EKG Interpretation None       Radiology No results found.  Procedures Procedures (including critical care time)  Medications Ordered in ED Medications  cefTRIAXone (ROCEPHIN) injection 250 mg (250 mg Intramuscular Given 01/24/17 1354)    azithromycin (ZITHROMAX) tablet 1,000 mg (1,000 mg Oral Given 01/24/17 1354)  lidocaine (XYLOCAINE) 1 % (with pres) injection (1.5 mLs  Given 01/24/17 1356)     Initial Impression / Assessment and Plan / ED Course  I have reviewed the triage vital signs and the nursing notes.  Pertinent labs & imaging results that were available during my care of the patient were reviewed by me and considered in my medical decision making (see chart for details).    Patient is afebrile without abdominal tenderness, abdominal pain or painful bowel movements to indicate prostatitis.  UA does not show infection. No tenderness to palpation of the testes or epididymis to suggest orchitis or epididymitis. STD cultures obtained including HIV, syphilis, gonorrhea and chlamydia. Patient would like to be treated for gonorrhea and chlamydia today, given his exposure. Patient to be discharged with instructions to follow-up with [facility] for blood pressure recheck. Discussed importance of using protection when sexually active. Pt understands that they have GC/Chlamydia cultures pending and that they will need to inform all sexual partners if results return positive. Patient has been treated prophylactically with azithromycin and Rocephin. Furthermore, patient has RPR and HIV pending, patient would like to leave before HIV result comes back. Have given him information to call regarding whether this is positive and patient agrees and voices understanding.   Final Clinical Impressions(s) / ED Diagnoses   Final diagnoses:  STD exposure    New Prescriptions New Prescriptions   No medications on file     Lawrence Marseilles 01/24/17 1448    Pricilla Loveless, MD 01/24/17 [phone]

## 2017-01-25 LAB — GC/CHLAMYDIA PROBE AMP (~~LOC~~) NOT AT ARMC
Chlamydia: NEGATIVE
Neisseria Gonorrhea: NEGATIVE

## 2017-01-25 LAB — RPR: RPR: NONREACTIVE

## 2017-06-10 ENCOUNTER — Encounter (HOSPITAL_COMMUNITY): Payer: Self-pay

## 2017-06-10 ENCOUNTER — Other Ambulatory Visit: Payer: Self-pay

## 2017-06-10 DIAGNOSIS — R197 Diarrhea, unspecified: Secondary | ICD-10-CM | POA: Insufficient documentation

## 2017-06-10 DIAGNOSIS — R112 Nausea with vomiting, unspecified: Secondary | ICD-10-CM | POA: Insufficient documentation

## 2017-06-10 DIAGNOSIS — F17210 Nicotine dependence, cigarettes, uncomplicated: Secondary | ICD-10-CM | POA: Insufficient documentation

## 2017-06-10 DIAGNOSIS — R1084 Generalized abdominal pain: Secondary | ICD-10-CM | POA: Insufficient documentation

## 2017-06-10 LAB — CBC
HEMATOCRIT: 47 % (ref 39.0–52.0)
HEMOGLOBIN: 16.1 g/dL (ref 13.0–17.0)
MCH: 31.8 pg (ref 26.0–34.0)
MCHC: 34.3 g/dL (ref 30.0–36.0)
MCV: 92.9 fL (ref 78.0–100.0)
Platelets: 237 10*3/uL (ref 150–400)
RBC: 5.06 MIL/uL (ref 4.22–5.81)
RDW: 13.3 % (ref 11.5–15.5)
WBC: 9.5 10*3/uL (ref 4.0–10.5)

## 2017-06-10 NOTE — ED Triage Notes (Signed)
States abdominal pain and reflux type symptoms with vomiting unable to keep anything down.

## 2017-06-10 NOTE — ED Notes (Signed)
Pt reports n/v x7 that started this morning around 09:00. Also, c/o abdominal pain that shifts depending on which side he is laying. For example, laying on the left side, left side abdominal pain.

## 2017-06-11 ENCOUNTER — Emergency Department (HOSPITAL_COMMUNITY)
Admission: EM | Admit: 2017-06-11 | Discharge: 2017-06-11 | Disposition: A | Discharge status: Home / Self Care | Payer: Self-pay | Attending: Emergency Medicine | Admitting: Emergency Medicine

## 2017-06-11 DIAGNOSIS — R197 Diarrhea, unspecified: Secondary | ICD-10-CM

## 2017-06-11 DIAGNOSIS — R112 Nausea with vomiting, unspecified: Secondary | ICD-10-CM

## 2017-06-11 HISTORY — DX: Gastro-esophageal reflux disease without esophagitis: K21.9

## 2017-06-11 LAB — URINALYSIS, ROUTINE W REFLEX MICROSCOPIC
Bacteria, UA: NONE SEEN
Bilirubin Urine: NEGATIVE
GLUCOSE, UA: NEGATIVE mg/dL
Ketones, ur: 5 mg/dL — AB
Leukocytes, UA: NEGATIVE
Nitrite: NEGATIVE
PH: 5 (ref 5.0–8.0)
Protein, ur: NEGATIVE mg/dL
Specific Gravity, Urine: 1.026 (ref 1.005–1.030)
Squamous Epithelial / LPF: NONE SEEN

## 2017-06-11 LAB — COMPREHENSIVE METABOLIC PANEL
ALT: 17 U/L (ref 17–63)
AST: 22 U/L (ref 15–41)
Albumin: 3.7 g/dL (ref 3.5–5.0)
Alkaline Phosphatase: 55 U/L (ref 38–126)
Anion gap: 9 (ref 5–15)
BILIRUBIN TOTAL: 1.6 mg/dL — AB (ref 0.3–1.2)
BUN: 10 mg/dL (ref 6–20)
CO2: 25 mmol/L (ref 22–32)
CREATININE: 0.98 mg/dL (ref 0.61–1.24)
Calcium: 9 mg/dL (ref 8.9–10.3)
Chloride: 102 mmol/L (ref 101–111)
Glucose, Bld: 97 mg/dL (ref 65–99)
POTASSIUM: 3.9 mmol/L (ref 3.5–5.1)
Sodium: 136 mmol/L (ref 135–145)
TOTAL PROTEIN: 6.8 g/dL (ref 6.5–8.1)

## 2017-06-11 LAB — LIPASE, BLOOD: Lipase: 26 U/L (ref 11–51)

## 2017-06-11 MED ORDER — SODIUM CHLORIDE 0.9 % IV BOLUS (SEPSIS): 1000.0000 mL | Freq: Once | INTRAVENOUS | Status: DC

## 2017-06-11 MED ORDER — ONDANSETRON 8 MG PO TBDP
8.0000 mg | ORAL_TABLET | Freq: Once | ORAL | Status: AC
  Administered 2017-06-11: 8 mg via ORAL
  Filled 2017-06-11: qty 1

## 2017-06-11 MED ORDER — MORPHINE SULFATE (PF) 4 MG/ML IV SOLN: 4.0000 mg | Freq: Once | INTRAVENOUS | Status: DC

## 2017-06-11 MED ORDER — METOCLOPRAMIDE HCL 10 MG PO TABS: 10.0000 mg | ORAL_TABLET | Freq: Four times a day (QID) | ORAL | 0 refills | Status: DC | PRN

## 2017-06-11 MED ORDER — LOPERAMIDE HCL 2 MG PO CAPS
4.0000 mg | ORAL_CAPSULE | Freq: Once | ORAL | Status: AC
  Administered 2017-06-11: 4 mg via ORAL
  Filled 2017-06-11: qty 2

## 2017-06-11 MED ORDER — ONDANSETRON HCL 4 MG/2ML IJ SOLN: 4.0000 mg | Freq: Once | INTRAMUSCULAR | Status: DC

## 2017-06-11 NOTE — ED Provider Notes (Signed)
[facility]-EMERGENCY DEPT Provider Note   CSN: [id number] Arrival date & time: 06/10/17  2127     History   Chief Complaint Chief Complaint  Patient presents with  . Abdominal Pain    HPI Hayden Moore is a 27 y.o. male.  The history is provided by the patient.  He has a history of hypertension and GERD, comes in with abdominal pain, vomiting, diarrhea which started this morning.  He estimates 7 episodes of emesis and 7 loose to watery bowel movements.  Denies fever, chills, sweats.  There is generalized abdominal pain which he rates at 10/10.  There is momentary improvement in pain following emesis or bowel movement.  He denies any sick contacts.  There is been no treatment at home.  He denies any blood or mucus in the stool or emesis.  Past Medical History:  Diagnosis Date  . GERD (gastroesophageal reflux disease)     Patient Active Problem List   Diagnosis Date Noted  . TOBACCO USER 03/17/2009  . TINEA CORPORIS 03/16/2009  . PENILE PAIN 02/10/2009  . OBESITY 04/09/2008  . TINEA VERSICOLOR 02/12/2007  . HYPERTENSION, BENIGN SYSTEMIC 06/22/2006    History reviewed. No pertinent surgical history.     Home Medications    Prior to Admission medications   Not on File    Family History History reviewed. No pertinent family history.  Social History Social History   Tobacco Use  . Smoking status: Current Every Day Smoker  . Smokeless tobacco: Never Used  Substance Use Topics  . Alcohol use: Yes  . Drug use: No     Allergies   Patient has no known allergies.   Review of Systems Review of Systems  All other systems reviewed and are negative.    Physical Exam Updated Vital Signs BP 136/75 (BP Location: Left Arm)   Pulse 71   Temp 99.3 F (37.4 C) (Oral)   Resp 17   Ht 5' 11.5" (1.816 m)   Wt 118.9 kg (262 lb 3.2 oz)   SpO2 97%   BMI 36.06 kg/m   Physical Exam  Nursing note and vitals reviewed.  27 year old male,  resting comfortably and in no acute distress. Vital signs are normal. Oxygen saturation is 97%, which is normal. Head is normocephalic and atraumatic. PERRLA, EOMI. Oropharynx is clear. Neck is nontender and supple without adenopathy or JVD. Back is nontender and there is no CVA tenderness. Lungs are clear without rales, wheezes, or rhonchi. Chest is nontender. Heart has regular rate and rhythm without murmur. Abdomen is soft, flat, nontender without masses or hepatosplenomegaly and peristalsis is slightly hypoactive. Extremities have no cyanosis or edema, full range of motion is present. Skin is warm and dry without rash. Neurologic: Mental status is normal, cranial nerves are intact, there are no motor or sensory deficits.  ED Treatments / Results  Labs (all labs ordered are listed, but only abnormal results are displayed) Labs Reviewed  COMPREHENSIVE METABOLIC PANEL - Abnormal; Notable for the following components:      Result Value   Total Bilirubin 1.6 (*)    All other components within normal limits  URINALYSIS, ROUTINE W REFLEX MICROSCOPIC - Abnormal; Notable for the following components:   Hgb urine dipstick SMALL (*)    Ketones, ur 5 (*)    All other components within normal limits  LIPASE, BLOOD  CBC    Procedures Procedures (including critical care time)  Medications Ordered in ED Medications  loperamide (IMODIUM)  capsule 4 mg (not administered)  morphine 4 MG/ML injection 4 mg (not administered)  ondansetron (ZOFRAN-ODT) disintegrating tablet 8 mg (not administered)     Initial Impression / Assessment and Plan / ED Course  I have reviewed the triage vital signs and the nursing notes.  Pertinent lab results that were available during my care of the patient were reviewed by me and considered in my medical decision making (see chart for details).  Nausea, vomiting, diarrhea in pattern consistent with viral gastroenteritis, possible food poisoning.  Laboratory  workup is reassuring and exam is benign.  No red flags to suggest more serious illness.  He was offered IV fluids, but stated that he had waited long enough and was in a hurry to get home and just wanted some medication to be able to manage this at home.  He is given a dose of oral dissolving ondansetron and a dose of loperamide, advised to use over-the-counter loperamide given prescription for metoclopramide.  Old records are reviewed, and he has no relevant past visits.  Final Clinical Impressions(s) / ED Diagnoses   Final diagnoses:  Nausea vomiting and diarrhea    ED Discharge Orders        Ordered    metoCLOPramide (REGLAN) 10 MG tablet  Every 6 hours PRN     06/11/17 0141       Dione BoozeGlick, Mitul Hallowell, MD 06/11/17 0144

## 2017-06-11 NOTE — Discharge Instructions (Signed)
Drink only clear liauids. When you are reliably holding clear liquids down, then gradually advance your diet.   Take loperamide (Imodium AD) as needed for diarrhea.  Return if symptoms are getting worse.

## 2017-06-11 NOTE — ED Notes (Signed)
Attempted to assess pt, he states he is at a 10/10 for pain but has been here too long and wants to be discharged immediately.

## 2019-05-04 ENCOUNTER — Emergency Department (HOSPITAL_COMMUNITY): Payer: Self-pay

## 2019-05-04 ENCOUNTER — Encounter (HOSPITAL_COMMUNITY): Payer: Self-pay | Admitting: Emergency Medicine

## 2019-05-04 ENCOUNTER — Emergency Department (HOSPITAL_COMMUNITY)
Admission: EM | Admit: 2019-05-04 | Discharge: 2019-05-04 | Disposition: A | Discharge status: Home / Self Care | Payer: Self-pay | Attending: Emergency Medicine | Admitting: Emergency Medicine

## 2019-05-04 ENCOUNTER — Other Ambulatory Visit: Payer: Self-pay

## 2019-05-04 DIAGNOSIS — M25562 Pain in left knee: Secondary | ICD-10-CM | POA: Insufficient documentation

## 2019-05-04 DIAGNOSIS — M2559 Pain in other specified joint: Secondary | ICD-10-CM

## 2019-05-04 DIAGNOSIS — I10 Essential (primary) hypertension: Secondary | ICD-10-CM | POA: Insufficient documentation

## 2019-05-04 DIAGNOSIS — M25552 Pain in left hip: Secondary | ICD-10-CM | POA: Insufficient documentation

## 2019-05-04 DIAGNOSIS — F17210 Nicotine dependence, cigarettes, uncomplicated: Secondary | ICD-10-CM | POA: Insufficient documentation

## 2019-05-04 DIAGNOSIS — M545 Low back pain: Secondary | ICD-10-CM | POA: Insufficient documentation

## 2019-05-04 DIAGNOSIS — G8929 Other chronic pain: Secondary | ICD-10-CM | POA: Insufficient documentation

## 2019-05-04 LAB — COMPREHENSIVE METABOLIC PANEL
ALT: 21 U/L (ref 0–44)
AST: 27 U/L (ref 15–41)
Albumin: 3.5 g/dL (ref 3.5–5.0)
Alkaline Phosphatase: 70 U/L (ref 38–126)
Anion gap: 5 (ref 5–15)
BUN: 10 mg/dL (ref 6–20)
CO2: 26 mmol/L (ref 22–32)
Calcium: 8.8 mg/dL — ABNORMAL LOW (ref 8.9–10.3)
Chloride: 109 mmol/L (ref 98–111)
Creatinine, Ser: 1.14 mg/dL (ref 0.61–1.24)
GFR calc Af Amer: >60 mL/min (ref >60)
GFR calc non Af Amer: >60 mL/min (ref >60)
Glucose, Bld: 101 mg/dL — ABNORMAL HIGH (ref 70–99)
Potassium: 4.5 mmol/L (ref 3.5–5.1)
Sodium: 140 mmol/L (ref 135–145)
Total Bilirubin: 0.6 mg/dL (ref 0.3–1.2)
Total Protein: 6.6 g/dL (ref 6.5–8.1)

## 2019-05-04 MED ORDER — CYCLOBENZAPRINE HCL 10 MG PO TABS: 10.0000 mg | ORAL_TABLET | Freq: Two times a day (BID) | ORAL | 0 refills | Status: DC | PRN

## 2019-05-04 MED ORDER — PREDNISONE 20 MG PO TABS: 40.0000 mg | ORAL_TABLET | Freq: Every day | ORAL | 0 refills | Status: DC

## 2019-05-04 MED ORDER — NAPROXEN 375 MG PO TABS: 375.0000 mg | ORAL_TABLET | Freq: Two times a day (BID) | ORAL | 0 refills | Status: DC

## 2019-05-04 NOTE — ED Provider Notes (Signed)
[facility] DEPT Provider Note   CSN: [id number] Arrival date & time: 05/04/19  1330     History Chief Complaint  Patient presents with  . Back Pain  . Hip Pain    Hayden Moore is a 29 y.o. male.  Patient is a 29 year old male with a prior history of GERD and back injury 2 years ago presenting today with complaints of back pain, hip pain, knee pain.  Patient states that since his back injury 2 years ago when he was working at [employer] he started having the back pain and he was seen in the emergency room and given a short course of muscle relaxer and anti-inflammatories but he never followed up.  Since that time he has had pain almost every day which intermittently radiates into his left hip.  However within the last week he has started a new job with freight and is doing a lot of lifting and activity he has not done in a while and his pain is significantly worse.  It is throughout the lower back but with certain movements that radiates up into his upper spine.  He also noticed intermittently his hands will be tight and painful in his joints.  As well as his right knee intermittently will click and pop and occasionally give out.  He is not currently having pain in the right knee right now and currently his hands are not swollen or painful.  He denies any fever, recent injury, urinary or abdominal complaints.  He does smoke tobacco and marijuana but only drinks 1-2 beers per week.  He is taking no over-the-counter or prescription medications.  Because of his lack of insurance he is not followed up with any specialist or even a regular doctor.  The history is provided by the patient.  Back Pain Location:  Generalized Quality:  Aching, cramping and shooting Radiates to: left hip and radiates up the spine. Pain severity:  Severe Pain is:  Worse during the night Onset quality:  Gradual Duration: has been present 2 years but worse lately. Timing:   Constant Progression:  Worsening Chronicity:  New Context: lifting heavy objects and physical stress   Context: not recent illness   Relieved by:  None tried Worsened by:  Bending, movement and twisting Ineffective treatments: stretching. Associated symptoms: no abdominal pain, no fever, no headaches, no numbness, no weakness and no weight loss   Associated symptoms comment:  Intermittent finger and hand pain.  No trauma or fever.  No urinary symptoms but pain seems worse when he is dehydrated. Risk factors: no recent surgery and no steroid use   Hip Pain Pertinent negatives include no abdominal pain and no headaches.       Past Medical History:  Diagnosis Date  . GERD (gastroesophageal reflux disease)     Patient Active Problem List   Diagnosis Date Noted  . TOBACCO USER 03/17/2009  . TINEA CORPORIS 03/16/2009  . PENILE PAIN 02/10/2009  . OBESITY 04/09/2008  . TINEA VERSICOLOR 02/12/2007  . HYPERTENSION, BENIGN SYSTEMIC 06/22/2006    History reviewed. No pertinent surgical history.     History reviewed. No pertinent family history.  Social History   Tobacco Use  . Smoking status: Current Every Day Smoker  . Smokeless tobacco: Never Used  Substance Use Topics  . Alcohol use: Yes  . Drug use: No    Home Medications Prior to Admission medications   Medication Sig Start Date End Date Taking? Authorizing Provider  metoCLOPramide (  REGLAN) 10 MG tablet Take 1 tablet (10 mg total) by mouth every 6 (six) hours as needed for nausea (or headache). 06/11/17   Dione Booze, MD    Allergies    Patient has no known allergies.  Review of Systems   Review of Systems  Constitutional: Negative for fever and weight loss.  Gastrointestinal: Negative for abdominal pain.  Musculoskeletal: Positive for back pain.  Neurological: Negative for weakness, numbness and headaches.  All other systems reviewed and are negative.   Physical Exam Updated Vital Signs BP (!) 163/89 (BP  Location: Right Arm)   Pulse 86   Temp 99.9 F (37.7 C) (Oral)   Resp 16   Ht 5\' 11"  (1.803 m)   Wt 124.7 kg   SpO2 99%   BMI 38.35 kg/m   Physical Exam Vitals and nursing note reviewed.  Constitutional:      General: He is not in acute distress.    Appearance: He is well-developed.  HENT:     Head: Normocephalic and atraumatic.     Nose: Nose normal.  Eyes:     Conjunctiva/sclera: Conjunctivae normal.     Pupils: Pupils are equal, round, and reactive to light.  Cardiovascular:     Rate and Rhythm: Normal rate and regular rhythm.     Heart sounds: No murmur.  Pulmonary:     Effort: Pulmonary effort is normal. No respiratory distress.     Breath sounds: Normal breath sounds. No wheezing or rales.  Abdominal:     General: There is no distension.     Palpations: Abdomen is soft.     Tenderness: There is no abdominal tenderness. There is no guarding or rebound.  Musculoskeletal:        General: Tenderness present.     Cervical back: Normal range of motion and neck supple.     Lumbar back: Tenderness and bony tenderness present. No swelling or deformity. Decreased range of motion.       Back:     Right lower leg: Tenderness present. No deformity. No edema.     Left lower leg: Normal. No edema.     Comments: Tenderness along the medial meniscus and medial collateral ligament.  No ligamental laxity present.  No obvious swelling to the hands.  DIP, PIP and MCP joints are all within normal limits of both hands at this time.  Skin:    General: Skin is warm and dry.     Capillary Refill: Capillary refill takes less than 2 seconds.     Findings: No erythema or rash.  Neurological:     General: No focal deficit present.     Mental Status: He is alert and oriented to person, place, and time. Mental status is at baseline.  Psychiatric:        Mood and Affect: Mood normal.        Behavior: Behavior normal.        Thought Content: Thought content normal.     ED Results /  Procedures / Treatments   Labs (all labs ordered are listed, but only abnormal results are displayed) Labs Reviewed  COMPREHENSIVE METABOLIC PANEL - Abnormal; Notable for the following components:      Result Value   Glucose, Bld 101 (*)    Calcium 8.8 (*)    All other components within normal limits    EKG None  Radiology DG Lumbar Spine Complete  Result Date: 05/04/2019 CLINICAL DATA:  Pain.  Work requires repetitive heavy lifting EXAM:  LUMBAR SPINE - COMPLETE 4+ VIEW COMPARISON:  None. FINDINGS: Frontal, lateral, spot lumbosacral lateral, and bilateral oblique views were obtained. There are 5 non-rib-bearing lumbar type vertebral bodies. T12 ribs are virtually aplastic. No fracture or spondylolisthesis. There is mild disc space narrowing at L1-2, L3-4, L4-5. There is facet osteoarthritic change at L5-S1 bilaterally. IMPRESSION: There is a degree of osteoarthritic change at several levels. No evident fracture or spondylolisthesis. Electronically Signed   By: Bretta Bang III M.D.   On: 05/04/2019 14:31   DG Knee Complete 4 Views Right  Result Date: 05/04/2019 CLINICAL DATA:  Pain with job requiring repetitive heavy lifting EXAM: RIGHT KNEE - COMPLETE 4+ VIEW COMPARISON:  None. FINDINGS: Frontal, lateral, and bilateral oblique views were obtained. No evident fracture or dislocation. No joint effusion. The joint spaces appear unremarkable. No erosive change. IMPRESSION: No fracture or dislocation. No joint effusion. No appreciable arthropathy. Electronically Signed   By: Bretta Bang III M.D.   On: 05/04/2019 14:31   DG Hip Unilat W or Wo Pelvis 2-3 Views Left  Result Date: 05/04/2019 CLINICAL DATA:  Pain with repetitive heavy lifting EXAM: DG HIP (WITH OR WITHOUT PELVIS) 2-3V LEFT COMPARISON:  Sep 10, 2016 FINDINGS: Frontal pelvis as well as frontal and lateral left hip images were obtained. No fracture or dislocation. Joint spaces appear normal. No erosive change. IMPRESSION: No  fracture or dislocation.  No evident arthropathy. Electronically Signed   By: Bretta Bang III M.D.   On: 05/04/2019 14:30    Procedures Procedures (including critical care time)  Medications Ordered in ED Medications - No data to display  ED Course  I have reviewed the triage vital signs and the nursing notes.  Pertinent labs & imaging results that were available during my care of the patient were reviewed by me and considered in my medical decision making (see chart for details).    MDM Rules/Calculators/A&P                      29 year old male presenting with lower back pain intermittent left hip pain that has been ongoing for 2 years but worsening.  Patient recently started a new job 1 week ago which is most likely the exacerbating factor.  However patient has had intermittent bouts of hand pain and discomfort as well as intermittent right knee pain and occasionally giving out.  Patient is only 29 years old and has not followed up with a PCP or a specialist but concerned that this may be an underlying form of arthritis possibly rheumatoid arthritis.  Patient denies any infectious symptoms.  Temperature here is 99.9 but he denies any Covid-like symptoms.  He is having no neurologic symptoms such as urinary retention or fecal incontinence.  He is having no weakness in his legs.  Patient is also hypertensive here 163/89 but no prior history of hypertension.  3:48 PM Patient's labs are within normal limits.  Knee and hip imaging is normal but lumbar spine shows signs of osteoarthritis.  Findings discussed with the patient.  Will start prednisone, naproxen and Flexeril as needed.  Also encourage patient to follow-up with the PCP so his blood pressure could be monitored and they can also help him with his back and if he needs referrals to specialist if it is not improving.  Final Clinical Impression(s) / ED Diagnoses Final diagnoses:  Chronic midline low back pain without sciatica  Pain  in other joint    Rx / DC Orders ED  Discharge Orders         Ordered    predniSONE (DELTASONE) 20 MG tablet  Daily     05/04/19 1545    cyclobenzaprine (FLEXERIL) 10 MG tablet  2 times daily PRN     05/04/19 1545    naproxen (NAPROSYN) 375 MG tablet  2 times daily     05/04/19 1545           Gwyneth Sprout, MD 05/04/19 1549

## 2019-05-04 NOTE — ED Triage Notes (Signed)
Pt states that he hurt his back 2 years ago lifting a slab of granite and now has back pain, L hip pain, L knee pain, coccyx pain and finger pain. Also states he doesn't know if his kidney or liver hurt but when he drinks water it feels better. Alert and oriented.

## 2019-05-04 NOTE — Discharge Instructions (Signed)
Make sure you are stretching, taking hot showers and using muscle rubs as needed.  It is very important for you to get a regular doctor so they can start helping you with this problem and also continue to evaluate your blood pressure.

## 2019-05-09 ENCOUNTER — Encounter (HOSPITAL_COMMUNITY): Payer: Self-pay | Admitting: Emergency Medicine

## 2019-05-09 ENCOUNTER — Other Ambulatory Visit: Payer: Self-pay

## 2019-05-09 ENCOUNTER — Emergency Department (HOSPITAL_COMMUNITY)
Admission: EM | Admit: 2019-05-09 | Discharge: 2019-05-09 | Disposition: A | Discharge status: Home / Self Care | Payer: Self-pay | Attending: Emergency Medicine | Admitting: Emergency Medicine

## 2019-05-09 DIAGNOSIS — F17210 Nicotine dependence, cigarettes, uncomplicated: Secondary | ICD-10-CM | POA: Insufficient documentation

## 2019-05-09 DIAGNOSIS — G8929 Other chronic pain: Secondary | ICD-10-CM | POA: Insufficient documentation

## 2019-05-09 DIAGNOSIS — I10 Essential (primary) hypertension: Secondary | ICD-10-CM | POA: Insufficient documentation

## 2019-05-09 DIAGNOSIS — M545 Low back pain, unspecified: Secondary | ICD-10-CM

## 2019-05-09 MED ORDER — CYCLOBENZAPRINE HCL 10 MG PO TABS: 10.0000 mg | ORAL_TABLET | Freq: Two times a day (BID) | ORAL | 0 refills | Status: DC | PRN

## 2019-05-09 MED ORDER — NAPROXEN 500 MG PO TABS: 500.0000 mg | ORAL_TABLET | Freq: Two times a day (BID) | ORAL | 0 refills | Status: DC

## 2019-05-09 MED ORDER — NAPROXEN 500 MG PO TABS
500.0000 mg | ORAL_TABLET | Freq: Once | ORAL | Status: AC
  Administered 2019-05-09: 18:00:00 500 mg via ORAL
  Filled 2019-05-09: qty 1

## 2019-05-09 MED ORDER — LIDOCAINE 5 % EX PTCH
1.0000 | MEDICATED_PATCH | CUTANEOUS | Status: DC
  Administered 2019-05-09: 18:00:00 1 via TRANSDERMAL
  Filled 2019-05-09: qty 1

## 2019-05-09 MED ORDER — PREDNISONE 20 MG PO TABS: 40.0000 mg | ORAL_TABLET | Freq: Every day | ORAL | 0 refills | Status: DC

## 2019-05-09 NOTE — Discharge Instructions (Signed)
You were seen here today for Back Pain: Low back pain is discomfort in the lower back that may be due to injuries to muscles and ligaments around the spine. Occasionally, it may be caused by a problem to a part of the spine called a disc. Your back pain should be treated with medicines listed below as well as back exercises and this back pain should get better over the next 2 weeks. Most patients get completely well in 4 weeks. It is important to know however, if you develop severe or worsening pain, low back pain with fever, numbness, weakness or inability to walk or urinate, you should return to the ER immediately.  Please follow up with your doctor this week for a recheck if still having symptoms.  HOME INSTRUCTIONS Self - care:  The application of heat can help soothe the pain.  Maintaining your daily activities, including walking (this is encouraged), as it will help you get better faster than just staying in bed. Do not life, push, pull anything more than 10 pounds for the next week. I am attaching back exercises that you can do at home to help facilitate your recovery.   Back Exercises - I have attached a handout on back exercises that can be done at home to help facilitate your recovery.   Medications are also useful to help with pain control.   Acetaminophen.  This medication is generally safe, and found over the counter. Take as directed for your age. You should not take more than 8 of the extra strength (500mg ) pills a day (max dose is 4000mg  total OVER one day)  Non steroidal anti inflammatory: This includes medications including Ibuprofen, naproxen and Mobic; These medications help both pain and swelling and are very useful in treating back pain.  They should be taken with food, as they can cause stomach upset, and more seriously, stomach bleeding. Do not combine the medications.   Lidocaine Patch: Salon Pas lidocaine patches (blue and silver box) can be purchased over the counter and worn  for 12 hours for local pain relief   Muscle relaxants:  These medications can help with muscle tightness that is a cause of lower back pain.  Most of these medications can cause drowsiness, and it is not safe to drive or use dangerous machinery while taking them. They are primarily helpful when taken at night before sleep.  Prednisone -This is an oral steroid.  This medication is best taken with food in the morning.  Please note that this medication can cause anxiety, mood swings, muscle fatigue, increased hunger, weight gain (sodium/fluid retention), poor sleep as well as other symptoms. If you are a diabetic, please monitor your blood sugars at home as this medication can increase your blood sugars. Call your pharmacist if you have any questions.  You will need to follow up with your primary healthcare provider or the Orthopedist in 1-2 weeks for reassessment and persistent symptoms.  Be aware that if you develop new symptoms, such as a fever, leg weakness, difficulty with or loss of control of your urine or bowels, abdominal pain, or more severe pain, you will need to seek medical attention and/or return to the Emergency department. Additional Information:  Your vital signs today were: BP (!) 129/104   Pulse 65   Temp 98.9 F (37.2 C) (Oral)   Resp 16   SpO2 99%  If your blood pressure (BP) was elevated above 135/85 this visit, please have this repeated by your doctor within  one month. ---------------

## 2019-05-09 NOTE — ED Provider Notes (Signed)
[facility]-EMERGENCY DEPT Provider Note   CSN: [id number] Arrival date & time: 05/09/19  1457     History Chief Complaint  Patient presents with  . work note    Hayden Moore is a 29 y.o. male.  Hayden Moore is a 29 y.o. male with a history of GERD, chronic back pain, who presents to the ED due to continued pain over his low back.  He was seen in the ED on 1/9 for similar symptoms. States he had an injury to his back 2 years ago when working at a [employer] and ever since then has intermittently had back pain that radiates into the left hip.  He recently started a new job with [employer] doing a lot of lifting and this has been worsening his pain.  When he was seen in the ED on 1/9 he had labs and x-rays which were reassuring.  States that he was prescribed a muscle relaxer, NSAID and steroid, but has been unable to fill these prescriptions due to financial constraints.  He has been taking 600 mg of ibuprofen in the morning and 400 in the evening before bed with mild improvement.  States that he has not seen a specialist regarding this continued pain.  Has not been able to go back to work due to continued pain and was told to get reevaluated and to keep getting doctors notes, work is in the process of trying to transition him to a different department with less physical labor.  No associated numbness or weakness, no saddle anesthesia, no loss of bowel or bladder control, no abdominal pain or fevers.       Past Medical History:  Diagnosis Date  . GERD (gastroesophageal reflux disease)     Patient Active Problem List   Diagnosis Date Noted  . TOBACCO USER 03/17/2009  . TINEA CORPORIS 03/16/2009  . PENILE PAIN 02/10/2009  . OBESITY 04/09/2008  . TINEA VERSICOLOR 02/12/2007  . HYPERTENSION, BENIGN SYSTEMIC 06/22/2006    History reviewed. No pertinent surgical history.     No family history on file.  Social History   Tobacco Use  . Smoking status: Current  Every Day Smoker  . Smokeless tobacco: Never Used  Substance Use Topics  . Alcohol use: Yes  . Drug use: No    Home Medications Prior to Admission medications   Medication Sig Start Date End Date Taking? Authorizing Provider  cyclobenzaprine (FLEXERIL) 10 MG tablet Take 1 tablet (10 mg total) by mouth 2 (two) times daily as needed for muscle spasms. 05/09/19   Dartha Lodge, PA-C  metoCLOPramide (REGLAN) 10 MG tablet Take 1 tablet (10 mg total) by mouth every 6 (six) hours as needed for nausea (or headache). 06/11/17   Dione Booze, MD  naproxen (NAPROSYN) 500 MG tablet Take 1 tablet (500 mg total) by mouth 2 (two) times daily. 05/09/19   Dartha Lodge, PA-C  predniSONE (DELTASONE) 20 MG tablet Take 2 tablets (40 mg total) by mouth daily. 05/09/19   Dartha Lodge, PA-C    Allergies    Patient has no known allergies.  Review of Systems   Review of Systems  Constitutional: Negative for chills and fever.  HENT: Negative.   Respiratory: Negative for shortness of breath.   Cardiovascular: Negative for chest pain.  Gastrointestinal: Negative for abdominal pain, constipation, diarrhea, nausea and vomiting.  Genitourinary: Negative for dysuria, flank pain, frequency and hematuria.  Musculoskeletal: Positive for back pain. Negative for arthralgias, gait problem, joint  swelling, myalgias and neck pain.  Skin: Negative for color change, rash and wound.  Neurological: Negative for weakness and numbness.    Physical Exam Updated Vital Signs BP (!) 136/91   Pulse 70   Temp 98 F (36.7 C) (Oral)   Resp 16   SpO2 98%   Physical Exam Vitals and nursing note reviewed.  Constitutional:      General: He is not in acute distress.    Appearance: He is well-developed. He is not diaphoretic.  HENT:     Head: Atraumatic.  Eyes:     General:        Right eye: No discharge.        Left eye: No discharge.  Cardiovascular:     Pulses:          Radial pulses are 2+ on the right side and 2+ on  the left side.       Dorsalis pedis pulses are 2+ on the right side and 2+ on the left side.       Posterior tibial pulses are 2+ on the right side and 2+ on the left side.  Pulmonary:     Effort: Pulmonary effort is normal. No respiratory distress.  Abdominal:     General: Bowel sounds are normal. There is no distension.     Palpations: Abdomen is soft. There is no mass.     Tenderness: There is no abdominal tenderness. There is no guarding.     Comments: Abdomen soft, nondistended, nontender to palpation in all quadrants without guarding or peritoneal signs, no CVA tenderness bilaterally  Musculoskeletal:     Cervical back: Neck supple.     Comments: Tenderness to palpation over midline lumbar spine extending into the left low back musculature.  Pain is worse with lower extremity movement  Skin:    General: Skin is warm and dry.     Capillary Refill: Capillary refill takes less than 2 seconds.  Neurological:     Mental Status: He is alert and oriented to person, place, and time.     Comments: Alert, clear speech, following commands. Moving all extremities without difficulty. Bilateral lower extremities with 5/5 strength in proximal and distal muscle groups and with dorsi and plantar flexion. Sensation intact in bilateral lower extremities. Ambulatory with steady gait  Psychiatric:        Mood and Affect: Mood normal.        Behavior: Behavior normal.     ED Results / Procedures / Treatments   Labs (all labs ordered are listed, but only abnormal results are displayed) Labs Reviewed - No data to display  EKG None  Radiology No results found.  Procedures Procedures (including critical care time)  Medications Ordered in ED Medications  lidocaine (LIDODERM) 5 % 1 patch (1 patch Transdermal Patch Applied 05/09/19 1759)  naproxen (NAPROSYN) tablet 500 mg (500 mg Oral Given 05/09/19 1759)    ED Course  I have reviewed the triage vital signs and the nursing  notes.  Pertinent labs & imaging results that were available during my care of the patient were reviewed by me and considered in my medical decision making (see chart for details).    MDM Rules/Calculators/A&P                      Patient with back pain.  No neurological deficits and normal neuro exam.  Patient can walk but states is painful.  No loss of bowel or bladder control.  No concern for cauda equina.  No fever, night sweats, weight loss, h/o cancer, IVDU.  RICE protocol and pain medicine indicated and discussed with patient.  Divided patient with good Rx discount pharmacy card and represcribed medications to a pharmacy that will hopefully be more affordable.  Provided him with follow-up resources and work note.  Final Clinical Impression(s) / ED Diagnoses Final diagnoses:  Chronic midline low back pain without sciatica    Rx / DC Orders ED Discharge Orders         Ordered    predniSONE (DELTASONE) 20 MG tablet  Daily     05/09/19 1741    naproxen (NAPROSYN) 500 MG tablet  2 times daily     05/09/19 1741    cyclobenzaprine (FLEXERIL) 10 MG tablet  2 times daily PRN     05/09/19 1741           Jacqlyn Larsen, PA-C 05/09/19 2059    Charlesetta Shanks, MD 05/11/19 0008

## 2019-05-09 NOTE — ED Triage Notes (Signed)
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 Pt also requesting note saying that can't do the current role his is in due to pains with lifting and moving.

## 2020-04-01 ENCOUNTER — Encounter (HOSPITAL_COMMUNITY): Payer: Self-pay | Admitting: Emergency Medicine

## 2020-04-01 ENCOUNTER — Emergency Department (HOSPITAL_COMMUNITY)
Admission: EM | Admit: 2020-04-01 | Discharge: 2020-04-01 | Disposition: A | Discharge status: Home / Self Care | Payer: Self-pay | Attending: Emergency Medicine | Admitting: Emergency Medicine

## 2020-04-01 ENCOUNTER — Other Ambulatory Visit: Payer: Self-pay

## 2020-04-01 DIAGNOSIS — G8929 Other chronic pain: Secondary | ICD-10-CM | POA: Insufficient documentation

## 2020-04-01 DIAGNOSIS — M545 Low back pain, unspecified: Secondary | ICD-10-CM | POA: Insufficient documentation

## 2020-04-01 DIAGNOSIS — F17200 Nicotine dependence, unspecified, uncomplicated: Secondary | ICD-10-CM | POA: Insufficient documentation

## 2020-04-01 DIAGNOSIS — I10 Essential (primary) hypertension: Secondary | ICD-10-CM | POA: Insufficient documentation

## 2020-04-01 HISTORY — DX: Unspecified osteoarthritis, unspecified site: M19.90

## 2020-04-01 MED ORDER — CYCLOBENZAPRINE HCL 10 MG PO TABS: 10.0000 mg | ORAL_TABLET | Freq: Two times a day (BID) | ORAL | 0 refills | Status: DC | PRN

## 2020-04-01 MED ORDER — DEXAMETHASONE 4 MG PO TABS
6.0000 mg | ORAL_TABLET | Freq: Once | ORAL | Status: AC
  Administered 2020-04-01: 6 mg via ORAL
  Filled 2020-04-01: qty 1

## 2020-04-01 MED ORDER — LIDOCAINE 5 % EX PTCH
1.0000 | MEDICATED_PATCH | Freq: Once | CUTANEOUS | Status: DC
  Administered 2020-04-01: 1 via TRANSDERMAL
  Filled 2020-04-01: qty 1

## 2020-04-01 MED ORDER — KETOROLAC TROMETHAMINE 15 MG/ML IJ SOLN
15.0000 mg | Freq: Once | INTRAMUSCULAR | Status: AC
  Administered 2020-04-01: 15 mg via INTRAMUSCULAR
  Filled 2020-04-01: qty 1

## 2020-04-01 MED ORDER — MELOXICAM 7.5 MG PO TABS: 7.5000 mg | ORAL_TABLET | Freq: Every day | ORAL | 0 refills | Status: AC

## 2020-04-01 NOTE — ED Provider Notes (Signed)
[facility]-EMERGENCY DEPT Provider Note   CSN: [id number] Arrival date & time: 04/01/20  1141     History Chief Complaint  Patient presents with  . Back Pain  . Knee Pain    Hayden Moore is a 29 y.o. male with past medical history significant for arthritis and GERD.  HPI Patient presents to emergency department today with chief complaint of knee and back pain x 2 days.  Patient states he has a history of chronic back pain and arthritis which he thinks is causing his symptoms today.  He is endorsing constant aching and throbbing pain in his low back.  He has intermittent sharp pain.  Pain radiates across his back.  Does not radiate down his legs.  He tried taking Tylenol and ibuprofen yesterday without much symptom improvement.  He states he took a cold shower this morning and that helped his pain.  He rates the pain 8 out of 10 in severity.  He states this feels similar to the back pain he had 6 months ago.  He did not follow-up for his back surgery outpatient anywhere.  He states at work he does a lot of heavy lifting.  He also has to jump out of a truck multiple times started today.  Estimates jumping approximately 3 to 4 feet to the ground.  He is describing a burning sensation in bilateral knees as well.  Pain is intermittent.  He denies any swelling of his knees or lower extremities.  He states he had a hard time sleeping last night because of the pain.  Admits to pain worse with ambulation or sitting down in the same spot for long periods of time. Denies fevers, weight loss, numbness/weakness of upper and lower extremities, bowel/bladder incontinence, urinary retention, history of cancer, saddle anesthesia, history of back surgery, history of IVDA.    Past Medical History:  Diagnosis Date  . Arthritis   . GERD (gastroesophageal reflux disease)     Patient Active Problem List   Diagnosis Date Noted  . TOBACCO USER 03/17/2009  . TINEA CORPORIS 03/16/2009  .  PENILE PAIN 02/10/2009  . OBESITY 04/09/2008  . TINEA VERSICOLOR 02/12/2007  . HYPERTENSION, BENIGN SYSTEMIC 06/22/2006    History reviewed. No pertinent surgical history.     History reviewed. No pertinent family history.  Social History   Tobacco Use  . Smoking status: Current Every Day Smoker  . Smokeless tobacco: Never Used  Substance Use Topics  . Alcohol use: Yes  . Drug use: No    Home Medications Prior to Admission medications   Medication Sig Start Date End Date Taking? Authorizing Provider  cyclobenzaprine (FLEXERIL) 10 MG tablet Take 1 tablet (10 mg total) by mouth 2 (two) times daily as needed for muscle spasms. 04/01/20   Walisiewicz, Caroleen Hamman, PA-C  meloxicam (MOBIC) 7.5 MG tablet Take 1 tablet (7.5 mg total) by mouth daily for 7 days. 04/01/20 04/08/20  Shanon Ace, PA-C  metoCLOPramide (REGLAN) 10 MG tablet Take 1 tablet (10 mg total) by mouth every 6 (six) hours as needed for nausea (or headache). 06/11/17   Dione Booze, MD    Allergies    Patient has no known allergies.  Review of Systems   Review of Systems All other systems are reviewed and are negative for acute change except as noted in the HPI.  Physical Exam Updated Vital Signs BP 131/71   Pulse 63   Temp 98.5 F (36.9 C) (Oral)   Resp 18  Ht 5' 11.5" (1.816 m)   Wt 124.7 kg   SpO2 97%   BMI 37.82 kg/m   Physical Exam Vitals and nursing note reviewed.  Constitutional:      Appearance: He is well-developed. He is not ill-appearing or toxic-appearing.  HENT:     Head: Normocephalic and atraumatic.     Nose: Nose normal.  Eyes:     General: No scleral icterus.       Right eye: No discharge.        Left eye: No discharge.     Conjunctiva/sclera: Conjunctivae normal.  Neck:     Vascular: No JVD.  Cardiovascular:     Rate and Rhythm: Normal rate and regular rhythm.     Pulses: Normal pulses.          Radial pulses are 2+ on the right side and 2+ on the left side.        Dorsalis pedis pulses are 2+ on the right side and 2+ on the left side.     Heart sounds: Normal heart sounds.  Pulmonary:     Effort: Pulmonary effort is normal.     Breath sounds: Normal breath sounds.  Abdominal:     General: There is no distension.  Musculoskeletal:        General: Normal range of motion.     Cervical back: Normal and normal range of motion.     Thoracic back: Normal.     Lumbar back: Negative right straight leg raise test and negative left straight leg raise test.       Back:     Right knee: Normal.     Left knee: Normal.     Right ankle: Normal.     Left ankle: Normal.     Comments: No midline spinal tenderness.  Skin:    General: Skin is warm and dry.     Capillary Refill: Capillary refill takes less than 2 seconds.  Neurological:     Mental Status: He is oriented to person, place, and time.     GCS: GCS eye subscore is 4. GCS verbal subscore is 5. GCS motor subscore is 6.     Comments: Fluent speech, no facial droop.  Sensation grossly intact to light touch in the lower extremities bilaterally. No saddle anesthesias. Strength 5/5 with flexion and extension at the bilateral hips, knees, and ankles. Mildly antalgic gait. Coordination intact with heel to shin testing.   Psychiatric:        Behavior: Behavior normal.     ED Results / Procedures / Treatments   Labs (all labs ordered are listed, but only abnormal results are displayed) Labs Reviewed - No data to display  EKG None  Radiology No results found.  Procedures Procedures (including critical care time)  Medications Ordered in ED Medications  lidocaine (LIDODERM) 5 % 1 patch (has no administration in time range)  ketorolac (TORADOL) 15 MG/ML injection 15 mg (has no administration in time range)  dexamethasone (DECADRON) tablet 6 mg (has no administration in time range)    ED Course  I have reviewed the triage vital signs and the nursing notes.  Pertinent labs & imaging results  that were available during my care of the patient were reviewed by me and considered in my medical decision making (see chart for details).    MDM Rules/Calculators/A&P  History provided by patient with additional history obtained from chart review.    Patient with back pain.  No neurological deficits and normal neuro exam.  Patient can walk but states is painful.  No loss of bowel or bladder control.  Normal knee exam.  No concern for cauda equina.  No fever, night sweats, weight loss, h/o cancer, IVDU.  Review shows patient does not have history of renal insufficiency.  Pain treated with IM Toradol, Decadron and lidocaine patch.  Will discharge home with prescription for Flexeril as he is have difficulty sleeping because of the pain.  Advised not to drive or work when taking as it can make him drowsy.  Will also discharge with Mobic.  Advised patient he should follow-up with neurosurgery and PCP.  He does not have a PCP, given resources for community clinic. The patient appears reasonably screened and/or stabilized for discharge and I doubt any other medical condition or other [facility] requiring further screening, evaluation, or treatment in the ED at this time prior to discharge. The patient is safe for discharge with strict return precautions discussed.   Portions of this note were generated with Scientist, clinical (histocompatibility and immunogenetics). Dictation errors may occur despite best attempts at proofreading.   Final Clinical Impression(s) / ED Diagnoses Final diagnoses:  Chronic bilateral low back pain, unspecified whether sciatica present    Rx / DC Orders ED Discharge Orders         Ordered    cyclobenzaprine (FLEXERIL) 10 MG tablet  2 times daily PRN        04/01/20 1304    meloxicam (MOBIC) 7.5 MG tablet  Daily        04/01/20 1304           Shanon Ace, PA-C 04/01/20 1309    Bethann Berkshire, MD 04/03/20 1028

## 2020-04-01 NOTE — ED Triage Notes (Signed)
Patient presents with sudden back and knee pain. He believes it may be due to driving. He states he was barely able to walk and it hurts to sit down. HX Arthritis

## 2020-04-01 NOTE — Discharge Instructions (Addendum)
Lidocaine patches are over the patches are over the counter.  The salonpos brand with methyl salicylate might help your pain. You can try other brands if wanted.  -Prescription sent to your pharmacy for Mobic.  This is an anti-inflammatory that will help with pain as well.  Do not take any additional Aleve, ibuprofen, Motrin or Advil as these medications are all similar.  -Prescription sent to the pharmacy for Flexeril.  There is a muscle relaxer.  If you make you drowsy so do not drive or work when taking.  Most able take it in the evening as it can make him fall asleep.  -You can take Tylenol at the same time as Mobic if needed for pain.  -Follow-up with the community health clinic.  Call to schedule the next couple appointment.  -Also recommend you follow-up with [facility] neurosurgery to further discuss your back pain.  When you call you should states emergency room follow-up visit.  Return to the emergency department for any new or worsening symptoms.

## 2020-04-21 ENCOUNTER — Encounter (HOSPITAL_COMMUNITY): Payer: Self-pay | Admitting: Emergency Medicine

## 2020-04-21 ENCOUNTER — Emergency Department (HOSPITAL_COMMUNITY)
Admission: EM | Admit: 2020-04-21 | Discharge: 2020-04-21 | Disposition: A | Discharge status: Home / Self Care | Payer: Self-pay | Attending: Emergency Medicine | Admitting: Emergency Medicine

## 2020-04-21 DIAGNOSIS — K0889 Other specified disorders of teeth and supporting structures: Secondary | ICD-10-CM | POA: Insufficient documentation

## 2020-04-21 DIAGNOSIS — Z5321 Procedure and treatment not carried out due to patient leaving prior to being seen by health care provider: Secondary | ICD-10-CM | POA: Insufficient documentation

## 2020-04-21 NOTE — ED Triage Notes (Signed)
Per pt, states left upper dental pain that started 3 days ago-unable to get a dentist appointment

## 2020-04-21 NOTE — ED Notes (Signed)
Called 3x. Eloped from waiting area.  °

## 2020-10-08 ENCOUNTER — Emergency Department (HOSPITAL_COMMUNITY): Payer: Self-pay

## 2020-10-08 ENCOUNTER — Other Ambulatory Visit: Payer: Self-pay

## 2020-10-08 ENCOUNTER — Encounter (HOSPITAL_COMMUNITY): Payer: Self-pay

## 2020-10-08 ENCOUNTER — Emergency Department (HOSPITAL_COMMUNITY)
Admission: EM | Admit: 2020-10-08 | Discharge: 2020-10-08 | Disposition: A | Discharge status: Home / Self Care | Payer: Self-pay | Attending: Emergency Medicine | Admitting: Emergency Medicine

## 2020-10-08 DIAGNOSIS — R1032 Left lower quadrant pain: Secondary | ICD-10-CM | POA: Insufficient documentation

## 2020-10-08 DIAGNOSIS — K219 Gastro-esophageal reflux disease without esophagitis: Secondary | ICD-10-CM | POA: Insufficient documentation

## 2020-10-08 DIAGNOSIS — R197 Diarrhea, unspecified: Secondary | ICD-10-CM | POA: Insufficient documentation

## 2020-10-08 DIAGNOSIS — K6389 Other specified diseases of intestine: Secondary | ICD-10-CM

## 2020-10-08 DIAGNOSIS — I10 Essential (primary) hypertension: Secondary | ICD-10-CM | POA: Insufficient documentation

## 2020-10-08 DIAGNOSIS — F17210 Nicotine dependence, cigarettes, uncomplicated: Secondary | ICD-10-CM | POA: Insufficient documentation

## 2020-10-08 LAB — URINALYSIS, ROUTINE W REFLEX MICROSCOPIC
Bilirubin Urine: NEGATIVE
Glucose, UA: NEGATIVE mg/dL
Hgb urine dipstick: NEGATIVE
Ketones, ur: NEGATIVE mg/dL
Leukocytes,Ua: NEGATIVE
Nitrite: NEGATIVE
Protein, ur: NEGATIVE mg/dL
Specific Gravity, Urine: >1.03 — ABNORMAL HIGH (ref 1.005–1.030)
pH: 6 (ref 5.0–8.0)

## 2020-10-08 LAB — COMPREHENSIVE METABOLIC PANEL
ALT: 85 U/L — ABNORMAL HIGH (ref 0–44)
AST: 78 U/L — ABNORMAL HIGH (ref 15–41)
Albumin: 3.7 g/dL (ref 3.5–5.0)
Alkaline Phosphatase: 57 U/L (ref 38–126)
Anion gap: 7 (ref 5–15)
BUN: 14 mg/dL (ref 6–20)
CO2: 23 mmol/L (ref 22–32)
Calcium: 8.9 mg/dL (ref 8.9–10.3)
Chloride: 105 mmol/L (ref 98–111)
Creatinine, Ser: 1 mg/dL (ref 0.61–1.24)
GFR, Estimated: >60 mL/min (ref >60)
Glucose, Bld: 109 mg/dL — ABNORMAL HIGH (ref 70–99)
Potassium: 3.8 mmol/L (ref 3.5–5.1)
Sodium: 135 mmol/L (ref 135–145)
Total Bilirubin: 0.4 mg/dL (ref 0.3–1.2)
Total Protein: 6.9 g/dL (ref 6.5–8.1)

## 2020-10-08 LAB — CBC
HCT: 45 % (ref 39.0–52.0)
Hemoglobin: 14.8 g/dL (ref 13.0–17.0)
MCH: 31.9 pg (ref 26.0–34.0)
MCHC: 32.9 g/dL (ref 30.0–36.0)
MCV: 97 fL (ref 80.0–100.0)
Platelets: 238 10*3/uL (ref 150–400)
RBC: 4.64 MIL/uL (ref 4.22–5.81)
RDW: 13.3 % (ref 11.5–15.5)
WBC: 7.7 10*3/uL (ref 4.0–10.5)
nRBC: 0 % (ref 0.0–0.2)

## 2020-10-08 LAB — LIPASE, BLOOD: Lipase: 35 U/L (ref 11–51)

## 2020-10-08 MED ORDER — SODIUM CHLORIDE (PF) 0.9 % IJ SOLN
INTRAMUSCULAR | Status: AC
  Filled 2020-10-08: qty 50

## 2020-10-08 MED ORDER — MORPHINE SULFATE (PF) 4 MG/ML IV SOLN
4.0000 mg | Freq: Once | INTRAVENOUS | Status: AC
  Administered 2020-10-08: 14:00:00 4 mg via INTRAVENOUS
  Filled 2020-10-08: qty 1

## 2020-10-08 MED ORDER — ONDANSETRON HCL 4 MG/2ML IJ SOLN
4.0000 mg | Freq: Once | INTRAMUSCULAR | Status: AC
  Administered 2020-10-08: 14:00:00 4 mg via INTRAVENOUS
  Filled 2020-10-08: qty 2

## 2020-10-08 MED ORDER — IOHEXOL 300 MG/ML  SOLN
100.0000 mL | Freq: Once | INTRAMUSCULAR | Status: AC | PRN
  Administered 2020-10-08: 15:00:00 100 mL via INTRAVENOUS

## 2020-10-08 MED ORDER — KETOROLAC TROMETHAMINE 30 MG/ML IJ SOLN
30.0000 mg | Freq: Once | INTRAMUSCULAR | Status: AC
  Administered 2020-10-08: 16:00:00 30 mg via INTRAVENOUS
  Filled 2020-10-08: qty 1

## 2020-10-08 NOTE — ED Notes (Addendum)
This RN reviewed discharge instructions with pt via teachback method. Pt verbalized understanding and denies any further questions. No further care needed at this time. E-signature not able to be obtained d/t malfunction of signature pad

## 2020-10-08 NOTE — Discharge Instructions (Addendum)
Your abdominal pain is in the context of epiploic appendagitis.  This is a benign condition.  It is self-limiting which will means that it will heal on its own.  I recommend ibuprofen 600 mg every 6 hours as needed for pain control.  I provided you with a referral to gastroenterology to have as needed if your symptoms fail to improve with conservative therapy.  Return to the ER seek immediate medical attention should you experience any new or worsening symptoms.

## 2020-10-08 NOTE — ED Provider Notes (Signed)
Received patient as a handoff at shift change from [place] Robinson, PA-C.  In short, patient is a 30 year old male with past medical history of HTN and GERD who presented to the ED with a 3-day history of progressively worsening left-sided abdominal pain.  Patient had guarding on abdominal exam.  At time of handoff, CT abdomen pelvis with contrast is pending.  Plan to be determined based on imaging and patient's improvement here in the ER.  Physical Exam  BP (!) 159/89   Pulse (!) 52   Temp 98.4 F (36.9 C) (Oral)   Resp 18   Ht 6' (1.829 m)   Wt 131.5 kg   SpO2 97%   BMI 39.33 kg/m   Physical Exam Vitals and nursing note reviewed. Exam conducted with a chaperone present.  Constitutional:      Appearance: Normal appearance.  HENT:     Head: Normocephalic and atraumatic.  Eyes:     General: No scleral icterus.    Conjunctiva/sclera: Conjunctivae normal.  Pulmonary:     Effort: Pulmonary effort is normal.  Skin:    General: Skin is dry.  Neurological:     Mental Status: He is alert.     GCS: GCS eye subscore is 4. GCS verbal subscore is 5. GCS motor subscore is 6.  Psychiatric:        Mood and Affect: Mood normal.        Behavior: Behavior normal.        Thought Content: Thought content normal.    ED Course/Procedures     Procedures Results for orders placed or performed during the hospital encounter of 10/08/20  Lipase, blood  Result Value Ref Range   Lipase 35 11 - 51 U/L  Comprehensive metabolic panel  Result Value Ref Range   Sodium 135 135 - 145 mmol/L   Potassium 3.8 3.5 - 5.1 mmol/L   Chloride 105 98 - 111 mmol/L   CO2 23 22 - 32 mmol/L   Glucose, Bld 109 (H) 70 - 99 mg/dL   BUN 14 6 - 20 mg/dL   Creatinine, Ser 2.13 0.61 - 1.24 mg/dL   Calcium 8.9 8.9 - 08.6 mg/dL   Total Protein 6.9 6.5 - 8.1 g/dL   Albumin 3.7 3.5 - 5.0 g/dL   AST 78 (H) 15 - 41 U/L   ALT 85 (H) 0 - 44 U/L   Alkaline Phosphatase 57 38 - 126 U/L   Total Bilirubin 0.4 0.3 - 1.2  mg/dL   GFR, Estimated >57 >84 mL/min   Anion gap 7 5 - 15  CBC  Result Value Ref Range   WBC 7.7 4.0 - 10.5 K/uL   RBC 4.64 4.22 - 5.81 MIL/uL   Hemoglobin 14.8 13.0 - 17.0 g/dL   HCT 69.6 29.5 - 28.4 %   MCV 97.0 80.0 - 100.0 fL   MCH 31.9 26.0 - 34.0 pg   MCHC 32.9 30.0 - 36.0 g/dL   RDW 13.2 44.0 - 10.2 %   Platelets 238 150 - 400 K/uL   nRBC 0.0 0.0 - 0.2 %  Urinalysis, Routine w reflex microscopic Urine, Clean Catch  Result Value Ref Range   Color, Urine YELLOW YELLOW   APPearance CLEAR CLEAR   Specific Gravity, Urine >1.030 (H) 1.005 - 1.030   pH 6.0 5.0 - 8.0   Glucose, UA NEGATIVE NEGATIVE mg/dL   Hgb urine dipstick NEGATIVE NEGATIVE   Bilirubin Urine NEGATIVE NEGATIVE   Ketones, ur NEGATIVE NEGATIVE mg/dL  Protein, ur NEGATIVE NEGATIVE mg/dL   Nitrite NEGATIVE NEGATIVE   Leukocytes,Ua NEGATIVE NEGATIVE   CT Abdomen Pelvis W Contrast  Result Date: 10/08/2020 CLINICAL DATA:  Left lower quadrant abdominal pain for 2 days. EXAM: CT ABDOMEN AND PELVIS WITH CONTRAST TECHNIQUE: Multidetector CT imaging of the abdomen and pelvis was performed using the standard protocol following bolus administration of intravenous contrast. CONTRAST:  OMNIPAQUE IOHEXOL 300 MG/ML  SOLN COMPARISON:  None. FINDINGS: Lower chest: Unremarkable. Hepatobiliary: No suspicious focal abnormality within the liver parenchyma. There is no evidence for gallstones, gallbladder wall thickening, or pericholecystic fluid. No intrahepatic or extrahepatic biliary dilation. Pancreas: No focal mass lesion. No dilatation of the main duct. No intraparenchymal cyst. No peripancreatic edema. Spleen: No splenomegaly. No focal mass lesion. Adrenals/Urinary Tract: No adrenal nodule or mass. Kidneys unremarkable. No evidence for hydroureter. The urinary bladder appears normal for the degree of distention. Stomach/Bowel: Stomach is nondistended. Duodenum is normally positioned as is the ligament of Treitz. No small bowel  wall thickening. No small bowel dilatation. The terminal ileum is normal. The appendix is normal. There is a small focus of edema/inflammation along the distal descending colon without background diverticulosis. Coronal imaging shows oval ring like component to the edema/inflammation, characteristic for epiploic appendagitis (see coronal 62/4, also noted on sagittal 156/5). Vascular/Lymphatic: No abdominal aortic aneurysm. No abdominal aortic atherosclerotic calcification. There is no gastrohepatic or hepatoduodenal ligament lymphadenopathy. No retroperitoneal or mesenteric lymphadenopathy. No pelvic sidewall lymphadenopathy. Reproductive: The prostate gland and seminal vesicles are unremarkable. Other: No intraperitoneal free fluid. Musculoskeletal: No worrisome lytic or sclerotic osseous abnormality. IMPRESSION: 1. Small focus of edema/inflammation along the distal descending colon without background diverticulosis. Imaging features are most characteristic for epiploic appendagitis. 2. Otherwise unremarkable exam. Electronically Signed   By: Kennith Center M.D.   On: 10/08/2020 15:37    MDM   CT abdomen pelvis demonstrates small focus of edema/inflammation along the distal descending colon without background diverticulosis.  This is most characteristic for epiploic appendagitis.  Given findings, will treat with Toradol here in the ER prior to discharge.  On my examination, he is in no acute distress.  Resting comfortably.  Denies any nausea and states that he is tolerating fluids and food without difficulty.  Denies any fevers or chills at home.  Will encourage outpatient follow-up, but injured patient that the disease is self-limiting and benign.  Rarely is it associated with complications.  Laboratory work-up without leukocytosis.  Treatment is NSAIDs.  Patient voices understanding and is agreeable to the plan.     Lorelee New, PA-C 10/08/20 1611    Pricilla Loveless, MD 10/08/20 2021

## 2020-10-08 NOTE — ED Triage Notes (Signed)
Patient c/o left abdominal pain x 3 days. Patient denies any N/v/D.

## 2020-10-08 NOTE — ED Provider Notes (Signed)
[facility]-EMERGENCY DEPT Provider Note   CSN: [id number] Arrival date & time: 10/08/20  1256     History Chief Complaint  Patient presents with   Abdominal Pain    Hayden Moore is a 30 y.o. male past medical history of GERD, hypertension, presenting for evaluation of left-sided abdominal pain that began 3 days ago.  Pain has been gradually worsening, initially was intermittent and now is constant.  Pain is worse with particular movement as well as deep breathing or coughing.  He has had some loose stools though attributes this to recent diet modification to more healthier foods.  He has not had any nausea or vomiting, no urinary symptoms, no testicular pain.  May have some chills, no fevers.  No history of abdominal surgeries or diverticulitis.   The history is provided by the patient.      Past Medical History:  Diagnosis Date   Arthritis    GERD (gastroesophageal reflux disease)     Patient Active Problem List   Diagnosis Date Noted   TOBACCO USER 03/17/2009   TINEA CORPORIS 03/16/2009   PENILE PAIN 02/10/2009   OBESITY 04/09/2008   TINEA VERSICOLOR 02/12/2007   HYPERTENSION, BENIGN SYSTEMIC 06/22/2006    History reviewed. No pertinent surgical history.     Family History  Family history unknown: Yes    Social History   Tobacco Use   Smoking status: Every Day    Packs/day: 0.50    Pack years: 0.00    Types: Cigarettes   Smokeless tobacco: Never  Vaping Use   Vaping Use: Never used  Substance Use Topics   Alcohol use: Yes   Drug use: No    Home Medications Prior to Admission medications   Medication Sig Start Date End Date Taking? Authorizing Provider  cyclobenzaprine (FLEXERIL) 10 MG tablet Take 1 tablet (10 mg total) by mouth 2 (two) times daily as needed for muscle spasms. 04/01/20   Walisiewicz, Caroleen Hamman, PA-C  metoCLOPramide (REGLAN) 10 MG tablet Take 1 tablet (10 mg total) by mouth every 6 (six) hours as needed for nausea  (or headache). 06/11/17   Dione Booze, MD    Allergies    Patient has no known allergies.  Review of Systems   Review of Systems  Gastrointestinal:  Positive for abdominal pain and diarrhea.  All other systems reviewed and are negative.  Physical Exam Updated Vital Signs BP 129/72   Pulse 60   Temp 98.4 F (36.9 C) (Oral)   Resp 18   Ht 6' (1.829 m)   Wt 131.5 kg   SpO2 96%   BMI 39.33 kg/m   Physical Exam Vitals and nursing note reviewed.  Constitutional:      Appearance: He is well-developed. He is not ill-appearing.  HENT:     Head: Normocephalic and atraumatic.  Eyes:     Conjunctiva/sclera: Conjunctivae normal.  Cardiovascular:     Rate and Rhythm: Normal rate and regular rhythm.  Pulmonary:     Effort: Pulmonary effort is normal. No respiratory distress.     Breath sounds: Normal breath sounds.  Abdominal:     General: Abdomen is flat. Bowel sounds are normal.     Palpations: Abdomen is soft.     Tenderness: There is abdominal tenderness in the left upper quadrant and left lower quadrant. There is guarding. There is no rebound.  Skin:    General: Skin is warm.  Neurological:     Mental Status: He is alert.  Psychiatric:  Behavior: Behavior normal.    ED Results / Procedures / Treatments   Labs (all labs ordered are listed, but only abnormal results are displayed) Labs Reviewed  COMPREHENSIVE METABOLIC PANEL - Abnormal; Notable for the following components:      Result Value   Glucose, Bld 109 (*)    AST 78 (*)    ALT 85 (*)    All other components within normal limits  LIPASE, BLOOD  CBC  URINALYSIS, ROUTINE W REFLEX MICROSCOPIC    EKG None  Radiology No results found.  Procedures Procedures   Medications Ordered in ED Medications  sodium chloride (PF) 0.9 % injection (has no administration in time range)  morphine 4 MG/ML injection 4 mg (4 mg Intravenous Given 10/08/20 1414)  ondansetron (ZOFRAN) injection 4 mg (4 mg Intravenous  Given 10/08/20 1414)  iohexol (OMNIPAQUE) 300 MG/ML solution 100 mL (100 mLs Intravenous Contrast Given 10/08/20 1511)    ED Course  I have reviewed the triage vital signs and the nursing notes.  Pertinent labs & imaging results that were available during my care of the patient were reviewed by me and considered in my medical decision making (see chart for details).    MDM Rules/Calculators/A&P                          Patient is a healthy 30 year old male presenting for 3 days of worsening left lower quadrant abdominal pain with some loose stools.  No history of abdominal surgery or diverticulitis.  His symptoms do somewhat sound peritonitic and has some tenderness in the left lower quadrant as well on exam.  His vital signs are stable.  Blood work is ordered as well as CT scan with concern for acute abdomen.  Blood work so far is reassuring, no leukocytosis or lipase elevation.  He does have minimally elevated LFTs though pain does not seem related.  Pending CT scan, care is handed off at shift change to Evelena Leyden PA-C, to follow CT scan and disposition appropriately. Final Clinical Impression(s) / ED Diagnoses Final diagnoses:  None    Rx / DC Orders ED Discharge Orders     None        Genova Kiner, [place] N, PA-C 10/08/20 1524    Jacalyn Lefevre, MD 10/12/20 [phone]

## 2020-12-01 ENCOUNTER — Other Ambulatory Visit: Payer: Self-pay

## 2020-12-01 ENCOUNTER — Emergency Department (HOSPITAL_COMMUNITY)
Admission: EM | Admit: 2020-12-01 | Discharge: 2020-12-01 | Disposition: A | Discharge status: Home / Self Care | Payer: PRIVATE HEALTH INSURANCE | Attending: Emergency Medicine | Admitting: Emergency Medicine

## 2020-12-01 ENCOUNTER — Emergency Department (HOSPITAL_COMMUNITY): Payer: PRIVATE HEALTH INSURANCE

## 2020-12-01 ENCOUNTER — Encounter (HOSPITAL_COMMUNITY): Payer: Self-pay | Admitting: Emergency Medicine

## 2020-12-01 DIAGNOSIS — R0602 Shortness of breath: Secondary | ICD-10-CM | POA: Diagnosis present

## 2020-12-01 DIAGNOSIS — I10 Essential (primary) hypertension: Secondary | ICD-10-CM | POA: Diagnosis not present

## 2020-12-01 DIAGNOSIS — U071 COVID-19: Secondary | ICD-10-CM | POA: Insufficient documentation

## 2020-12-01 DIAGNOSIS — F17210 Nicotine dependence, cigarettes, uncomplicated: Secondary | ICD-10-CM | POA: Diagnosis not present

## 2020-12-01 LAB — CBC WITH DIFFERENTIAL/PLATELET
Abs Immature Granulocytes: 0.02 10*3/uL (ref 0.00–0.07)
Basophils Absolute: 0 10*3/uL (ref 0.0–0.1)
Basophils Relative: 0 %
Eosinophils Absolute: 0 10*3/uL (ref 0.0–0.5)
Eosinophils Relative: 0 %
HCT: 47.3 % (ref 39.0–52.0)
Hemoglobin: 15.9 g/dL (ref 13.0–17.0)
Immature Granulocytes: 0 %
Lymphocytes Relative: 29 %
Lymphs Abs: 1.7 10*3/uL (ref 0.7–4.0)
MCH: 31.2 pg (ref 26.0–34.0)
MCHC: 33.6 g/dL (ref 30.0–36.0)
MCV: 92.7 fL (ref 80.0–100.0)
Monocytes Absolute: 0.7 10*3/uL (ref 0.1–1.0)
Monocytes Relative: 12 %
Neutro Abs: 3.5 10*3/uL (ref 1.7–7.7)
Neutrophils Relative %: 59 %
Platelets: 216 10*3/uL (ref 150–400)
RBC: 5.1 MIL/uL (ref 4.22–5.81)
RDW: 12.6 % (ref 11.5–15.5)
WBC: 5.9 10*3/uL (ref 4.0–10.5)
nRBC: 0 % (ref 0.0–0.2)

## 2020-12-01 LAB — COMPREHENSIVE METABOLIC PANEL WITH GFR
ALT: 30 U/L (ref 0–44)
AST: 30 U/L (ref 15–41)
Albumin: 4.3 g/dL (ref 3.5–5.0)
Alkaline Phosphatase: 69 U/L (ref 38–126)
Anion gap: 13 (ref 5–15)
BUN: 10 mg/dL (ref 6–20)
CO2: 23 mmol/L (ref 22–32)
Calcium: 9.7 mg/dL (ref 8.9–10.3)
Chloride: 99 mmol/L (ref 98–111)
Creatinine, Ser: 0.89 mg/dL (ref 0.61–1.24)
GFR, Estimated: >60 mL/min
Glucose, Bld: 95 mg/dL (ref 70–99)
Potassium: 3.8 mmol/L (ref 3.5–5.1)
Sodium: 135 mmol/L (ref 135–145)
Total Bilirubin: 0.6 mg/dL (ref 0.3–1.2)
Total Protein: 8.7 g/dL — ABNORMAL HIGH (ref 6.5–8.1)

## 2020-12-01 LAB — RESP PANEL BY RT-PCR (FLU A&B, COVID) ARPGX2
Influenza A by PCR: NEGATIVE
Influenza B by PCR: NEGATIVE
SARS Coronavirus 2 by RT PCR: POSITIVE — AB

## 2020-12-01 MED ORDER — ALBUTEROL SULFATE HFA 108 (90 BASE) MCG/ACT IN AERS: 2.0000 | INHALATION_SPRAY | RESPIRATORY_TRACT | 0 refills | Status: DC | PRN

## 2020-12-01 MED ORDER — ALBUTEROL SULFATE HFA 108 (90 BASE) MCG/ACT IN AERS
2.0000 | INHALATION_SPRAY | RESPIRATORY_TRACT | Status: DC | PRN
  Administered 2020-12-01: 2 via RESPIRATORY_TRACT
  Filled 2020-12-01: qty 6.7

## 2020-12-01 MED ORDER — NIRMATRELVIR/RITONAVIR (PAXLOVID)TABLET
3.0000 | ORAL_TABLET | Freq: Two times a day (BID) | ORAL | Status: DC
  Administered 2020-12-01: 3 via ORAL
  Filled 2020-12-01: qty 30

## 2020-12-01 NOTE — ED Notes (Signed)
Pharmacy has been notified of the need for Paxlovid for discharge.

## 2020-12-01 NOTE — ED Provider Notes (Signed)
[facility]-EMERGENCY DEPT Provider Note   CSN: [id number] Arrival date & time: 12/01/20  0945     History Chief Complaint  Patient presents with  . Shortness of Breath    Hayden Moore is a 30 y.o. male.  Patient states he got sick about 3 days ago with cough and shortness of breath and weakness.  The history is provided by the patient and medical records. No language interpreter was used.  Shortness of Breath Severity:  Moderate Onset quality:  Sudden Duration:  3 days Timing:  Constant Progression:  Worsening Chronicity:  New Context: activity   Relieved by:  Nothing Worsened by:  Nothing Ineffective treatments:  None tried Associated symptoms: no abdominal pain, no chest pain, no cough, no headaches and no rash       Past Medical History:  Diagnosis Date  . Arthritis   . GERD (gastroesophageal reflux disease)     Patient Active Problem List   Diagnosis Date Noted  . TOBACCO USER 03/17/2009  . TINEA CORPORIS 03/16/2009  . PENILE PAIN 02/10/2009  . OBESITY 04/09/2008  . TINEA VERSICOLOR 02/12/2007  . HYPERTENSION, BENIGN SYSTEMIC 06/22/2006    History reviewed. No pertinent surgical history.     Family History  Family history unknown: Yes    Social History   Tobacco Use  . Smoking status: Every Day    Packs/day: 0.50    Types: Cigarettes  . Smokeless tobacco: Never  Vaping Use  . Vaping Use: Never used  Substance Use Topics  . Alcohol use: Yes  . Drug use: No    Home Medications Prior to Admission medications   Not on File    Allergies    Patient has no known allergies.  Review of Systems   Review of Systems  Constitutional:  Negative for appetite change and fatigue.  HENT:  Negative for congestion, ear discharge and sinus pressure.   Eyes:  Negative for discharge.  Respiratory:  Positive for shortness of breath. Negative for cough.   Cardiovascular:  Negative for chest pain.  Gastrointestinal:  Negative for  abdominal pain and diarrhea.  Genitourinary:  Negative for frequency and hematuria.  Musculoskeletal:  Negative for back pain.  Skin:  Negative for rash.  Neurological:  Negative for seizures and headaches.  Psychiatric/Behavioral:  Negative for hallucinations.    Physical Exam Updated Vital Signs BP (!) 173/93   Pulse 60   Temp 98.6 F (37 C) (Oral)   Resp (!) 32   Ht 6' (1.829 m)   Wt 129.3 kg   SpO2 98%   BMI 38.65 kg/m   Physical Exam Vitals and nursing note reviewed.  Constitutional:      Appearance: He is well-developed.  HENT:     Head: Normocephalic.     Nose: Nose normal.  Eyes:     General: No scleral icterus.    Conjunctiva/sclera: Conjunctivae normal.  Neck:     Thyroid: No thyromegaly.  Cardiovascular:     Rate and Rhythm: Normal rate and regular rhythm.     Heart sounds: No murmur heard.   No friction rub. No gallop.  Pulmonary:     Breath sounds: No stridor. No wheezing or rales.  Chest:     Chest wall: No tenderness.  Abdominal:     General: There is no distension.     Tenderness: There is no abdominal tenderness. There is no rebound.  Musculoskeletal:        General: Normal range of motion.  Cervical back: Neck supple.  Lymphadenopathy:     Cervical: No cervical adenopathy.  Skin:    Findings: No erythema or rash.  Neurological:     Mental Status: He is alert and oriented to person, place, and time.     Motor: No abnormal muscle tone.     Coordination: Coordination normal.  Psychiatric:        Behavior: Behavior normal.    ED Results / Procedures / Treatments   Labs (all labs ordered are listed, but only abnormal results are displayed) Labs Reviewed  RESP PANEL BY RT-PCR (FLU A&B, COVID) ARPGX2 - Abnormal; Notable for the following components:      Result Value   SARS Coronavirus 2 by RT PCR POSITIVE (*)    All other components within normal limits  COMPREHENSIVE METABOLIC PANEL - Abnormal; Notable for the following components:    Total Protein 8.7 (*)    All other components within normal limits  CBC WITH DIFFERENTIAL/PLATELET    EKG None  Radiology DG Chest 2 View  Result Date: 12/01/2020 CLINICAL DATA:  Cough, shortness of breath EXAM: CHEST - 2 VIEW COMPARISON:  None. FINDINGS: The cardiomediastinal silhouette is within normal limits. The lungs are clear, with no focal consolidation or pulmonary edema. There is no pleural effusion or pneumothorax. The bones are unremarkable. IMPRESSION: No radiographic evidence of acute cardiopulmonary process. Electronically Signed   By: Lesia Hausen MD   On: 12/01/2020 11:15    Procedures Procedures   Medications Ordered in ED Medications  albuterol (VENTOLIN HFA) 108 (90 Base) MCG/ACT inhaler 2 puff (2 puffs Inhalation Given 12/01/20 1037)  nirmatrelvir/ritonavir EUA (PAXLOVID) TABS 3 tablet (has no administration in time range)    ED Course  I have reviewed the triage vital signs and the nursing notes.  Pertinent labs & imaging results that were available during my care of the patient were reviewed by me and considered in my medical decision making (see chart for details).    MDM Rules/Calculators/A&P                           Patient with COVID-19.  He is given albuterol and paxlovid also patient has some high blood pressure in the emergency department that will be rechecked next few weeks Final Clinical Impression(s) / ED Diagnoses Final diagnoses:  COVID-19    Rx / DC Orders ED Discharge Orders     None        Bethann Berkshire, MD 12/01/20 1503

## 2020-12-01 NOTE — Discharge Instructions (Addendum)
Take Tylenol Motrin for pain drink plenty of fluids.  Follow-up if not improving.  You need to have your blood pressure checked within the next month after you get better from this infection because it has been running slightly high

## 2020-12-01 NOTE — ED Triage Notes (Signed)
Patient ambulatory to room 14- patient co having SOB x 3 days.  Co having a painful cough, hurts to cough. Patient reports 2 days ago felt like he was having night sweats.  Patient is non covid vaccinated.

## 2020-12-01 NOTE — ED Notes (Signed)
Dr Estell Harpin notified of Covid positive

## 2020-12-02 ENCOUNTER — Emergency Department (HOSPITAL_COMMUNITY): Payer: PRIVATE HEALTH INSURANCE

## 2020-12-02 ENCOUNTER — Emergency Department (HOSPITAL_COMMUNITY)
Admission: EM | Admit: 2020-12-02 | Discharge: 2020-12-02 | Disposition: A | Discharge status: Home / Self Care | Payer: PRIVATE HEALTH INSURANCE | Attending: Emergency Medicine | Admitting: Emergency Medicine

## 2020-12-02 DIAGNOSIS — I10 Essential (primary) hypertension: Secondary | ICD-10-CM | POA: Diagnosis not present

## 2020-12-02 DIAGNOSIS — Z28310 Unvaccinated for covid-19: Secondary | ICD-10-CM | POA: Diagnosis not present

## 2020-12-02 DIAGNOSIS — R002 Palpitations: Secondary | ICD-10-CM | POA: Diagnosis not present

## 2020-12-02 DIAGNOSIS — R61 Generalized hyperhidrosis: Secondary | ICD-10-CM | POA: Diagnosis not present

## 2020-12-02 DIAGNOSIS — F17210 Nicotine dependence, cigarettes, uncomplicated: Secondary | ICD-10-CM | POA: Insufficient documentation

## 2020-12-02 DIAGNOSIS — R109 Unspecified abdominal pain: Secondary | ICD-10-CM | POA: Diagnosis not present

## 2020-12-02 DIAGNOSIS — U071 COVID-19: Secondary | ICD-10-CM | POA: Diagnosis not present

## 2020-12-02 DIAGNOSIS — R0602 Shortness of breath: Secondary | ICD-10-CM | POA: Diagnosis present

## 2020-12-02 LAB — CBC WITH DIFFERENTIAL/PLATELET
Abs Immature Granulocytes: 0.02 10*3/uL (ref 0.00–0.07)
Basophils Absolute: 0 10*3/uL (ref 0.0–0.1)
Basophils Relative: 0 %
Eosinophils Absolute: 0 10*3/uL (ref 0.0–0.5)
Eosinophils Relative: 0 %
HCT: 42.9 % (ref 39.0–52.0)
Hemoglobin: 14 g/dL (ref 13.0–17.0)
Immature Granulocytes: 0 %
Lymphocytes Relative: 27 %
Lymphs Abs: 2.3 10*3/uL (ref 0.7–4.0)
MCH: 31.5 pg (ref 26.0–34.0)
MCHC: 32.6 g/dL (ref 30.0–36.0)
MCV: 96.6 fL (ref 80.0–100.0)
Monocytes Absolute: 1 10*3/uL (ref 0.1–1.0)
Monocytes Relative: 11 %
Neutro Abs: 5.2 10*3/uL (ref 1.7–7.7)
Neutrophils Relative %: 62 %
Platelets: 198 10*3/uL (ref 150–400)
RBC: 4.44 MIL/uL (ref 4.22–5.81)
RDW: 12.5 % (ref 11.5–15.5)
WBC: 8.5 10*3/uL (ref 4.0–10.5)
nRBC: 0 % (ref 0.0–0.2)

## 2020-12-02 LAB — BASIC METABOLIC PANEL
Anion gap: 10 (ref 5–15)
BUN: 13 mg/dL (ref 6–20)
CO2: 22 mmol/L (ref 22–32)
Calcium: 8.9 mg/dL (ref 8.9–10.3)
Chloride: 100 mmol/L (ref 98–111)
Creatinine, Ser: 0.96 mg/dL (ref 0.61–1.24)
GFR, Estimated: >60 mL/min (ref >60)
Glucose, Bld: 98 mg/dL (ref 70–99)
Potassium: 4.3 mmol/L (ref 3.5–5.1)
Sodium: 132 mmol/L — ABNORMAL LOW (ref 135–145)

## 2020-12-02 LAB — TROPONIN I (HIGH SENSITIVITY): Troponin I (High Sensitivity): 4 ng/L (ref <18)

## 2020-12-02 MED ORDER — KETOROLAC TROMETHAMINE 30 MG/ML IJ SOLN
15.0000 mg | Freq: Once | INTRAMUSCULAR | Status: AC
  Administered 2020-12-02: 15 mg via INTRAVENOUS
  Filled 2020-12-02: qty 1

## 2020-12-02 NOTE — ED Notes (Signed)
Pt alert, NAD, calm, interactive, resps e/ mildly labored, tachypneic RR22, reports feeling better after toradol. Pain 7/10.VSS.

## 2020-12-02 NOTE — ED Notes (Addendum)
SPO2 remains 94-97% with ambulation

## 2020-12-02 NOTE — ED Provider Notes (Signed)
Care handoff received from [facility], PA-C at shift change please see previous provider note for full details of visit.  In short 30 year old male presented for chest pain.  He was diagnosed with COVID-19 viral infection yesterday.  Patient received Paxlovid treatment yesterday.  Chest x-ray showed atelectasis versus COVID-pneumonia.  Reported as low risk by Wells criteria and PERC negative.  I have been asked to follow-up on troponin level prior to disposition. Physical Exam  BP 128/83   Pulse (!) 46   Temp 98.5 F (36.9 C) (Oral)   Resp (!) 24   SpO2 94%   Physical Exam Constitutional:      General: He is not in acute distress.    Appearance: Normal appearance. He is well-developed. He is not ill-appearing or diaphoretic.  HENT:     Head: Normocephalic and atraumatic.  Eyes:     General: Vision grossly intact. Gaze aligned appropriately.     Pupils: Pupils are equal, round, and reactive to light.  Neck:     Trachea: Trachea and phonation normal.  Cardiovascular:     Rate and Rhythm: Normal rate and regular rhythm.     Heart sounds: Normal heart sounds.  Pulmonary:     Effort: Pulmonary effort is normal. No respiratory distress.     Breath sounds: Normal breath sounds.  Abdominal:     General: There is no distension.     Palpations: Abdomen is soft.     Tenderness: There is no abdominal tenderness. There is no guarding or rebound.  Musculoskeletal:        General: Normal range of motion.     Cervical back: Normal range of motion.  Skin:    General: Skin is warm and dry.  Neurological:     Mental Status: He is alert.     GCS: GCS eye subscore is 4. GCS verbal subscore is 5. GCS motor subscore is 6.     Comments: Speech is clear and goal oriented, follows commands Major Cranial nerves without deficit, no facial droop Moves extremities without ataxia, coordination intact  Psychiatric:        Behavior: Behavior normal.    ED Course/Procedures     Procedures  MDM   Additional history obtained from: Nursing notes from this visit. Review of EMR. Family, patient's fiancee at bedside. ------------------------- CBC within normal limits, no leukocytosis to suggest bacterial infection.  No anemia or thrombocytopenia. High-sensitivity troponin of 4 within normal limits.  Onset symptoms over a day ago, no indication for delta troponin. BMP shows no emergent electrolytes derangement, AKI or gap.  CXR:  IMPRESSION:  Lower lung volumes with patchy bibasilar atelectasis versus COVID-19  pneumonia.   EKG: Sinus rhythm Early repolarization Confirmed by Geoffery Lyons ([id number]) on 12/02/2020 5:14:46 AM ---------- Patient reevaluated resting comfortably in bed no acute distress. No recurrence of fever here in the ER.  On my reevaluation patient without tachycardia, hypotension or tachypnea.  SPO2 99% on room air.  Respiratory rate 20 breaths/min.  Pulse low 60s.  Patient reports he is feeling improved after Toradol.  There is no indication for admission at this point, patient was ambulated by nursing staff without hypoxia on room air.  Low suspicion for ACS, PE, dissection or other emergent cardiopulmonary etiologies at this time.  Suspect patient symptoms secondary to known COVID infection.  Patient received Paxlovid yesterday.  I have encouraged patient to follow up with his PCP closely for reevaluation.  He will obtain a home pulse oximeter to monitor  his SPO2 and return for readings under 90% room air.  Patient advised use of OTC anti-inflammatory such as Tylenol, water hydration and rest.   At this time there does not appear to be any evidence of an acute emergency medical condition and the patient appears stable for discharge with appropriate outpatient follow up. Diagnosis was discussed with patient who verbalizes understanding of care plan and is agreeable to discharge. I have discussed return precautions with patient who verbalizes understanding. Patient encouraged  to follow-up with their PCP. All questions answered.  Patient's case discussed with Dr. Bernette Mayers who agrees with plan to discharge with follow-up.   Note: Portions of this report may have been transcribed using voice recognition software. Every effort was made to ensure accuracy; however, inadvertent computerized transcription errors may still be present.    Elizabeth Palau 12/02/20 1207    Pollyann Savoy, MD 12/02/20 [phone]

## 2020-12-02 NOTE — ED Triage Notes (Signed)
Pt arrived via [facility] for cc of chest pain (right side, through to back, 10/10), tachypnea, and shortness of breath. Patient seen at [facility] for same symptoms yesterday, called EMS this morning for increasing symptoms. Pt tested positive for COVID yesterday. Diaphoretic for EMS. Enroute, EMS administered 50 mcg of fentanyl, on 15L [place] for comfort, 18g LAC, 250CC NS.    EMS Vitals HR 76 SPO2 100% BP 160/P 130/86 RR 44 ETCO2 44

## 2020-12-02 NOTE — ED Provider Notes (Signed)
[facility] EMERGENCY DEPARTMENT Provider Note   CSN: [id number] Arrival date & time: 12/02/20  0459     History Chief Complaint  Patient presents with   Chest Pain   Covid Positive    Hayden Moore is a 30 y.o. male diagnosed with COVID 19 in our department yesterday morning, BIB EMS with a complaint of chest pain and shortness of breath.  States the pain and difficulty breathing never went away when he left the ED yesterday morning, but that he called EMS an hour ago because the pain was unbearable. States that he thinks that he is struggling to get a deep breath because he is in extreme pain rated 10/10.  Has pain that shoots from the right side of his chest to left side of chest. No COVID vaccines. Sx onset 4 days ago, feeling increasingly worse.   Chest Pain Associated symptoms: abdominal pain, diaphoresis, fatigue, nausea, palpitations and shortness of breath   Associated symptoms: no dizziness, no headache and no vomiting       Past Medical History:  Diagnosis Date   Arthritis    GERD (gastroesophageal reflux disease)     Patient Active Problem List   Diagnosis Date Noted   TOBACCO USER 03/17/2009   TINEA CORPORIS 03/16/2009   PENILE PAIN 02/10/2009   OBESITY 04/09/2008   TINEA VERSICOLOR 02/12/2007   HYPERTENSION, BENIGN SYSTEMIC 06/22/2006    No past surgical history on file.     Family History  Family history unknown: Yes    Social History   Tobacco Use   Smoking status: Every Day    Packs/day: 0.50    Types: Cigarettes   Smokeless tobacco: Never  Vaping Use   Vaping Use: Never used  Substance Use Topics   Alcohol use: Yes   Drug use: No    Home Medications Prior to Admission medications   Medication Sig Start Date End Date Taking? Authorizing Provider  albuterol (VENTOLIN HFA) 108 (90 Base) MCG/ACT inhaler Inhale 2 puffs into the lungs every 4 (four) hours as needed for wheezing or shortness of breath. 12/01/20   Bethann Berkshire, MD    Allergies    Patient has no known allergies.  Review of Systems   Review of Systems  Constitutional:  Positive for diaphoresis and fatigue.  Respiratory:  Positive for shortness of breath. Negative for chest tightness.   Cardiovascular:  Positive for chest pain and palpitations.  Gastrointestinal:  Positive for abdominal pain, diarrhea and nausea. Negative for vomiting.  Skin:  Negative for pallor.  Neurological:  Negative for dizziness, syncope, light-headedness and headaches.  Psychiatric/Behavioral:  The patient is nervous/anxious.    Physical Exam Updated Vital Signs BP 126/76   Pulse (!) 52   Temp 98.5 F (36.9 C) (Oral)   Resp 17   SpO2 92%   Physical Exam Vitals and nursing note reviewed.  Constitutional:      Appearance: Normal appearance.  HENT:     Head: Normocephalic and atraumatic.  Eyes:     General: No scleral icterus.    Conjunctiva/sclera: Conjunctivae normal.  Cardiovascular:     Rate and Rhythm: Normal rate and regular rhythm.     Heart sounds: No murmur heard. Pulmonary:     Effort: Tachypnea (Patient hyperventilating on exam) present. No respiratory distress.     Breath sounds: Normal breath sounds. No wheezing, rhonchi or rales.  Chest:     Chest wall: No tenderness.  Abdominal:     Palpations: Abdomen  is soft.     Tenderness: There is abdominal tenderness (RUQ). There is no guarding.  Musculoskeletal:     Right lower leg: No edema.     Left lower leg: No edema.  Skin:    Findings: No rash.  Neurological:     General: No focal deficit present.     Mental Status: He is alert.  Psychiatric:        Mood and Affect: Mood is anxious.    ED Results / Procedures / Treatments   Labs (all labs ordered are listed, but only abnormal results are displayed) Labs Reviewed  TROPONIN I (HIGH SENSITIVITY)    EKG EKG Interpretation  Date/Time:  Wednesday December 02 2020 05:12:06 EDT Ventricular Rate:  79 PR Interval:  170 QRS  Duration: 91 QT Interval:  368 QTC Calculation: 422 R Axis:   56 Text Interpretation: Sinus rhythm  Early repolarization Confirmed by Geoffery Lyons ([id number]) on 12/02/2020 5:14:46 AM  Radiology DG Chest Portable 1 View  Result Date: 12/02/2020 CLINICAL DATA:  30 year old male with shortness of breath, right chest pain radiating to the back. Positive for COVID-19. EXAM: PORTABLE CHEST 1 VIEW COMPARISON:  Chest radiographs 12/01/2020. FINDINGS: Portable AP semi upright view at 0542 hours. Lower lung volumes. Mediastinal contours remain normal. Visualized tracheal air column is within normal limits. Mild, patchy increased bibasilar opacity since yesterday. No pneumothorax, pulmonary edema or pleural effusion. Negative visible bowel gas and osseous structures. IMPRESSION: Lower lung volumes with patchy bibasilar atelectasis versus COVID-19 pneumonia. Electronically Signed   By: Odessa Fleming M.D.   On: 12/02/2020 06:39    Procedures Procedures   Medications Ordered in ED Medications  ketorolac (TORADOL) 30 MG/ML injection 15 mg (15 mg Intravenous Given 12/02/20 2010)    ED Course  I have reviewed the triage vital signs and the nursing notes.  Pertinent labs & imaging results that were available during my care of the patient were reviewed by me and considered in my medical decision making (see chart for details).  Patient evaluated on is extremely anxious.  We discussed limitations of treatment of his COVID 19 infection being that he got Paxlovid yesterday.   6:50am patient yelled out in pain due to right-sided CP radiating to right ear. Troponins ordered.  MDM Rules/Calculators/A&P The emergent differential diagnosis for shortness of breath includes, but is not limited to, Pulmonary edema, bronchoconstriction, Pneumonia, Pulmonary embolism, Pneumotherax/ Hemothorax, Dysrythmia, ACS. All of the above were considered in my evaluation of this patient.  Patient's chest x-ray shows bibasilar atelectasis  vs Covid pneumonia. Expected changes due to difficulty taking a deep breath. Patient's heart rate has remained normal. Elevated RR throughout assessments, however I anticipate this is due to pain and anxiety as his oxygen and mentation has remained stable.   Wells for PE places patient at low-risk of PE and able to apply [facility] rules. Remained in sinus rhythm on EKG with no signs of ischemia.   Final Clinical Impression(s) / ED Diagnoses Final diagnoses:  COVID-19   Patient signed out to oncoming provider. See their chart for continuation of care and dispo.    Woodroe Chen 12/02/20 0712    Geoffery Lyons, MD 12/02/20 [phone]

## 2020-12-02 NOTE — ED Notes (Signed)
ED PA at BS 

## 2020-12-02 NOTE — Discharge Instructions (Addendum)
At this time there does not appear to be the presence of an emergent medical condition, however there is always the potential for conditions to change. Please read and follow the below instructions.  Please return to the Emergency Department immediately for any new or worsening symptoms. Please be sure to follow up with your Primary Care Provider within one week regarding your visit today; please call their office to schedule an appointment even if you are feeling better for a follow-up visit. Please drink plenty of water and get plenty of rest.  You may use over-the-counter Tylenol as directed on the packaging to help with your symptoms.  He may take Tylenol 500 mg 4 times a day to help with your symptoms.  Do not take more than 3000 mg of Tylenol in 1 day. You have been given an NSAID-containing medication called Toradol today.  Do not take the medications including ibuprofen, Aleve, Advil, naproxen or other NSAID-containing medications for the next 2 days.  Please be sure to drink plenty of water over the next few days.  Go to the nearest Emergency Department immediately if you have: Trouble breathing Persistent pain or pressure in the chest New confusion Inability to wake or stay awake Pale, gray, or blue-colored skin, lips, or nail beds, depending on skin tone You have any new/concerning or worsening of symptoms.    Please read the additional information packets attached to your discharge summary.  Do not take your medicine if  develop an itchy rash, swelling in your mouth or lips, or difficulty breathing; call 911 and seek immediate emergency medical attention if this occurs.  You may review your lab tests and imaging results in their entirety on your MyChart account.  Please discuss all results of fully with your primary care provider and other specialist at your follow-up visit.  Note: Portions of this text may have been transcribed using voice recognition software. Every effort was  made to ensure accuracy; however, inadvertent computerized transcription errors may still be present.

## 2022-07-13 ENCOUNTER — Ambulatory Visit
Admission: EM | Admit: 2022-07-13 | Discharge: 2022-07-13 | Disposition: A | Discharge status: Home / Self Care | Payer: Self-pay | Attending: Family Medicine | Admitting: Family Medicine

## 2022-07-13 DIAGNOSIS — G8929 Other chronic pain: Secondary | ICD-10-CM

## 2022-07-13 DIAGNOSIS — M5442 Lumbago with sciatica, left side: Secondary | ICD-10-CM

## 2022-07-13 MED ORDER — CYCLOBENZAPRINE HCL 10 MG PO TABS: 10.0000 mg | ORAL_TABLET | Freq: Two times a day (BID) | ORAL | 0 refills | Status: DC | PRN

## 2022-07-13 MED ORDER — PREDNISONE 20 MG PO TABS: 40.0000 mg | ORAL_TABLET | Freq: Every day | ORAL | 0 refills | Status: AC

## 2022-07-13 MED ORDER — KETOROLAC TROMETHAMINE 30 MG/ML IJ SOLN
30.0000 mg | Freq: Once | INTRAMUSCULAR | Status: AC
  Administered 2022-07-13: 30 mg via INTRAMUSCULAR

## 2022-07-13 MED ORDER — METHYLPREDNISOLONE ACETATE 80 MG/ML IJ SUSP
80.0000 mg | Freq: Once | INTRAMUSCULAR | Status: AC
  Administered 2022-07-13: 80 mg via INTRAMUSCULAR

## 2022-07-13 NOTE — ED Provider Notes (Signed)
[facility]    CSN: [id number] Arrival date & time: 07/13/22  1043      History   Chief Complaint Chief Complaint  Patient presents with   Back Pain    HPI Hayden Moore is a 32 y.o. male.   Patient is here for back pain, which is a chronic issue.  This has flared up this week, started about 3 days ago, worsening the last several days.  Pain at the left low back,  radiating to the left hip and down the leg.  He will get a shooting pain down the leg to the toes at times.  No numbness or tingling currently.  Shooting pains if he lays straight.  This morning he took motrin and tylenol with some help.  He does not have insurance currently.    His last xray was 2021;  Degenerative changes at multiple areas of the lumbar spine;        Past Medical History:  Diagnosis Date   Arthritis    GERD (gastroesophageal reflux disease)     Patient Active Problem List   Diagnosis Date Noted   TOBACCO USER 03/17/2009   TINEA CORPORIS 03/16/2009   PENILE PAIN 02/10/2009   OBESITY 04/09/2008   TINEA VERSICOLOR 02/12/2007   HYPERTENSION, BENIGN SYSTEMIC 06/22/2006    History reviewed. No pertinent surgical history.     Home Medications    Prior to Admission medications   Medication Sig Start Date End Date Taking? Authorizing Provider  albuterol (VENTOLIN HFA) 108 (90 Base) MCG/ACT inhaler Inhale 2 puffs into the lungs every 4 (four) hours as needed for wheezing or shortness of breath. 12/01/20   Milton Ferguson, MD    Family History Family History  Family history unknown: Yes    Social History Social History   Tobacco Use   Smoking status: Every Day    Packs/day: .5    Types: Cigarettes   Smokeless tobacco: Never  Vaping Use   Vaping Use: Never used  Substance Use Topics   Alcohol use: Yes   Drug use: No     Allergies   Patient has no known allergies.   Review of Systems Review of Systems  Constitutional: Negative.   HENT: Negative.     Respiratory: Negative.    Cardiovascular: Negative.   Gastrointestinal: Negative.   Musculoskeletal:  Positive for back pain.  Skin: Negative.   Psychiatric/Behavioral: Negative.       Physical Exam Triage Vital Signs ED Triage Vitals  Enc Vitals Group     BP 07/13/22 1100 (!) 154/100     Pulse Rate 07/13/22 1100 89     Resp 07/13/22 1100 18     Temp 07/13/22 1100 98.7 F (37.1 C)     Temp Source 07/13/22 1100 Oral     SpO2 07/13/22 1100 97 %     Weight --      Height --      Head Circumference --      Peak Flow --      Pain Score 07/13/22 1101 8     Pain Loc --      Pain Edu? --      Excl. in [place]? --    No data found.  Updated Vital Signs BP (!) 154/100 (BP Location: Right Arm)   Pulse 89   Temp 98.7 F (37.1 C) (Oral)   Resp 18   SpO2 97%   Visual Acuity Right Eye Distance:   Left Eye Distance:  Bilateral Distance:    Right Eye Near:   Left Eye Near:    Bilateral Near:     Physical Exam Constitutional:      General: He is not in acute distress.    Appearance: Normal appearance. He is not ill-appearing.  Cardiovascular:     Rate and Rhythm: Normal rate.  Pulmonary:     Effort: Pulmonary effort is normal.  Musculoskeletal:     Comments: + TTP to the lumbar spine and left SI joint/back area;  no TTP to the left hip;  pain with any movement of the left leg/back  Skin:    General: Skin is warm.  Neurological:     General: No focal deficit present.     Mental Status: He is alert.  Psychiatric:        Mood and Affect: Mood normal.      UC Treatments / Results  Labs (all labs ordered are listed, but only abnormal results are displayed) Labs Reviewed - No data to display  EKG   Radiology No results found.  Procedures Procedures (including critical care time)  Medications Ordered in UC Medications  ketorolac (TORADOL) 30 MG/ML injection 30 mg (has no administration in time range)  methylPREDNISolone acetate (DEPO-MEDROL) injection 80 mg  (has no administration in time range)    Initial Impression / Assessment and Plan / UC Course  I have reviewed the triage vital signs and the nursing notes.  Pertinent labs & imaging results that were available during my care of the patient were reviewed by me and considered in my medical decision making (see chart for details).   Final Clinical Impressions(s) / UC Diagnoses   Final diagnoses:  Chronic left-sided low back pain with left-sided sciatica     Discharge Instructions      You were seen today for a flare up of your back/leg pain.  I have given you a shot of toradol and steroid while here today. I have sent out a script for an oral steroid to take daily starting TOMORROW.  I have also sent out a muscle relaxer.  This may make you tired/sleepy so please take when home and not driving.  You may take tylenol/motrin for pain.  You may use ice and a heating pad.  Please make an appointment with a primary care provider for further long term care of this issue.     ED Prescriptions     Medication Sig Dispense Auth. Provider   predniSONE (DELTASONE) 20 MG tablet Take 2 tablets (40 mg total) by mouth daily for 5 days. 10 tablet Aryianna Earwood, MD   cyclobenzaprine (FLEXERIL) 10 MG tablet Take 1 tablet (10 mg total) by mouth 2 (two) times daily as needed for muscle spasms. 20 tablet Rondel Oh, MD      PDMP not reviewed this encounter.   Rondel Oh, MD 07/13/22 1121

## 2022-07-13 NOTE — ED Triage Notes (Signed)
Pt c/o lower back pain onset ~ 6-7 years ago describes concern for a nerve damage. Continue to describe left sided hip pain with intermittent shooting pain down left leg. Says "sometimes it feels like my hip is dislocated." Not relieved with tylenol.   Reports not having pcp 2/2 insecure insurance status has been treated by different UC and ED areas.

## 2022-07-13 NOTE — Discharge Instructions (Addendum)
You were seen today for a flare up of your back/leg pain.  I have given you a shot of toradol and steroid while here today. I have sent out a script for an oral steroid to take daily starting TOMORROW.  I have also sent out a muscle relaxer.  This may make you tired/sleepy so please take when home and not driving.  You may take tylenol/motrin for pain.  You may use ice and a heating pad.  Please make an appointment with a primary care provider for further long term care of this issue.

## 2022-08-10 ENCOUNTER — Ambulatory Visit (INDEPENDENT_AMBULATORY_CARE_PROVIDER_SITE_OTHER): Payer: PRIVATE HEALTH INSURANCE | Admitting: Family Medicine

## 2022-08-10 ENCOUNTER — Encounter: Payer: Self-pay | Admitting: Family Medicine

## 2022-08-10 VITALS — BP 149/91 | HR 98 | Temp 97.9°F | Resp 16 | Ht 71.0 in | Wt 280.8 lb

## 2022-08-10 DIAGNOSIS — E6609 Other obesity due to excess calories: Secondary | ICD-10-CM

## 2022-08-10 DIAGNOSIS — F17200 Nicotine dependence, unspecified, uncomplicated: Secondary | ICD-10-CM

## 2022-08-10 DIAGNOSIS — Z7689 Persons encountering health services in other specified circumstances: Secondary | ICD-10-CM

## 2022-08-10 DIAGNOSIS — I10 Essential (primary) hypertension: Secondary | ICD-10-CM

## 2022-08-10 DIAGNOSIS — Z6839 Body mass index (BMI) 39.0-39.9, adult: Secondary | ICD-10-CM

## 2022-08-10 MED ORDER — LISINOPRIL 10 MG PO TABS: 10.0000 mg | ORAL_TABLET | Freq: Every day | ORAL | 0 refills | Status: DC

## 2022-08-10 NOTE — Progress Notes (Signed)
New Patient Office Visit  Subjective    Patient ID: Kervin Bones, male    DOB: 1990/07/21  Age: 32 y.o. MRN: [id number]  CC:  Chief Complaint  Patient presents with   Establish Care    HPI Anastasio Wogan presents to establish care and for review of chronic med issues. Patient is concerned about recent elevated BP readings.    Outpatient Encounter Medications as of 08/10/2022  Medication Sig   albuterol (VENTOLIN HFA) 108 (90 Base) MCG/ACT inhaler Inhale 2 puffs into the lungs every 4 (four) hours as needed for wheezing or shortness of breath.   cyclobenzaprine (FLEXERIL) 10 MG tablet Take 1 tablet (10 mg total) by mouth 2 (two) times daily as needed for muscle spasms.   lisinopril (ZESTRIL) 10 MG tablet Take 1 tablet (10 mg total) by mouth daily.   No facility-administered encounter medications on file as of 08/10/2022.    Past Medical History:  Diagnosis Date   Arthritis    GERD (gastroesophageal reflux disease)     No past surgical history on file.  Family History  Family history unknown: Yes    Social History   Socioeconomic History   Marital status: Single    Spouse name: Not on file   Number of children: Not on file   Years of education: Not on file   Highest education level: Not on file  Occupational History   Not on file  Tobacco Use   Smoking status: Every Day    Packs/day: .5    Types: Cigarettes   Smokeless tobacco: Never  Vaping Use   Vaping Use: Never used  Substance and Sexual Activity   Alcohol use: Yes   Drug use: No   Sexual activity: Not on file  Other Topics Concern   Not on file  Social History Narrative   Not on file   Social Determinants of Health   Financial Resource Strain: Not on file  Food Insecurity: Not on file  Transportation Needs: Not on file  Physical Activity: Not on file  Stress: Not on file  Social Connections: Not on file  Intimate Partner Violence: Not on file    Review of Systems  All other systems  reviewed and are negative.       Objective    BP (!) 149/91   Pulse 98   Temp 97.9 F (36.6 C) (Oral)   Resp 16   Ht  (1.803 m)   Wt 280 lb 12.8 oz (127.4 kg)   SpO2 93%   BMI 39.16 kg/m   Physical Exam Vitals and nursing note reviewed.  Constitutional:      General: He is not in acute distress. Cardiovascular:     Rate and Rhythm: Normal rate and regular rhythm.  Pulmonary:     Effort: Pulmonary effort is normal.     Breath sounds: Normal breath sounds.  Abdominal:     Palpations: Abdomen is soft.     Tenderness: There is no abdominal tenderness.  Neurological:     General: No focal deficit present.     Mental Status: He is alert and oriented to person, place, and time.         Assessment & Plan:   1. Essential hypertension Elevated readings. Lisinopril 10 mg prescribed. Monitoring labs ordered - CMP14+EGFR - Lipid Panel  2. Class 2 obesity due to excess calories with body mass index (BMI) of 39.0 to 39.9 in adult, unspecified whether serious comorbidity present Discussed dietary and activity  options.   3. Tobacco use disorder Discussed cessation/reduction  4. Encounter to establish care   Return in about 4 weeks (around 09/07/2022) for follow up.   [name], MD

## 2022-08-10 NOTE — Progress Notes (Signed)
Patient is here to established care with provider today. Patient has many health concern they would like to discuss with provider today  Care gaps discuss at appointment today  

## 2022-08-11 LAB — CMP14+EGFR
ALT: 61 IU/L — ABNORMAL HIGH (ref 0–44)
AST: 59 IU/L — ABNORMAL HIGH (ref 0–40)
Albumin/Globulin Ratio: 1.4 (ref 1.2–2.2)
Albumin: 4.3 g/dL (ref 4.1–5.1)
Alkaline Phosphatase: 96 IU/L (ref 44–121)
BUN/Creatinine Ratio: 6 — ABNORMAL LOW (ref 9–20)
BUN: 5 mg/dL — ABNORMAL LOW (ref 6–20)
Bilirubin Total: 0.5 mg/dL (ref 0.0–1.2)
CO2: 20 mmol/L (ref 20–29)
Calcium: 9.1 mg/dL (ref 8.7–10.2)
Chloride: 104 mmol/L (ref 96–106)
Creatinine, Ser: 0.79 mg/dL (ref 0.76–1.27)
Globulin, Total: 3 g/dL (ref 1.5–4.5)
Glucose: 92 mg/dL (ref 70–99)
Potassium: 4.1 mmol/L (ref 3.5–5.2)
Sodium: 139 mmol/L (ref 134–144)
Total Protein: 7.3 g/dL (ref 6.0–8.5)
eGFR: 121 mL/min/{1.73_m2} (ref >59)

## 2022-08-11 LAB — LIPID PANEL
Chol/HDL Ratio: 2.9 ratio (ref 0.0–5.0)
Cholesterol, Total: 154 mg/dL (ref 100–199)
HDL: 53 mg/dL (ref >39)
LDL Chol Calc (NIH): 88 mg/dL (ref 0–99)
Triglycerides: 68 mg/dL (ref 0–149)
VLDL Cholesterol Cal: 13 mg/dL (ref 5–40)

## 2022-09-14 ENCOUNTER — Ambulatory Visit: Payer: Self-pay | Admitting: Family Medicine

## 2022-10-04 ENCOUNTER — Other Ambulatory Visit: Payer: Self-pay

## 2022-10-04 ENCOUNTER — Emergency Department (HOSPITAL_COMMUNITY)
Admission: EM | Admit: 2022-10-04 | Discharge: 2022-10-05 | Disposition: A | Discharge status: Home / Self Care | Payer: Self-pay | Attending: Emergency Medicine | Admitting: Emergency Medicine

## 2022-10-04 ENCOUNTER — Encounter (HOSPITAL_COMMUNITY): Payer: Self-pay

## 2022-10-04 DIAGNOSIS — H5712 Ocular pain, left eye: Secondary | ICD-10-CM

## 2022-10-04 DIAGNOSIS — Z79899 Other long term (current) drug therapy: Secondary | ICD-10-CM | POA: Insufficient documentation

## 2022-10-04 DIAGNOSIS — Z20822 Contact with and (suspected) exposure to covid-19: Secondary | ICD-10-CM | POA: Insufficient documentation

## 2022-10-04 DIAGNOSIS — B349 Viral infection, unspecified: Secondary | ICD-10-CM | POA: Insufficient documentation

## 2022-10-04 DIAGNOSIS — I10 Essential (primary) hypertension: Secondary | ICD-10-CM | POA: Insufficient documentation

## 2022-10-04 MED ORDER — IBUPROFEN 800 MG PO TABS
800.0000 mg | ORAL_TABLET | Freq: Once | ORAL | Status: AC
  Administered 2022-10-04: 800 mg via ORAL
  Filled 2022-10-04: qty 1

## 2022-10-04 MED ORDER — ACETAMINOPHEN 500 MG PO TABS
1000.0000 mg | ORAL_TABLET | Freq: Once | ORAL | Status: AC
  Administered 2022-10-04: 1000 mg via ORAL
  Filled 2022-10-04: qty 2

## 2022-10-04 NOTE — ED Triage Notes (Signed)
Patient reports having pressure behind his eye and blurred vision since taking his blood pressure medication. Patient was prescribed Lisinopril 10mg /day 3 months ago. State she has headache and pain in his joints over the last few weeks.  Patient states "everything hurts". Medication was prescribed by PCP. Patient has not reached out to PCP to make them aware of symptoms.

## 2022-10-05 ENCOUNTER — Emergency Department (HOSPITAL_COMMUNITY): Payer: Self-pay

## 2022-10-05 LAB — RESP PANEL BY RT-PCR (RSV, FLU A&B, COVID)  RVPGX2
Influenza A by PCR: NEGATIVE
Influenza B by PCR: NEGATIVE
Resp Syncytial Virus by PCR: NEGATIVE
SARS Coronavirus 2 by RT PCR: NEGATIVE

## 2022-10-05 NOTE — ED Provider Notes (Signed)
[place] [facility] Provider Note   CSN: [id number] Arrival date & time: 10/04/22  2101     History  Chief Complaint  Patient presents with   Eye Problem    Hayden Moore is a 32 y.o. male.  The history is provided by the patient.  Eye Problem Location:  Left eye Severity:  Moderate Onset quality:  Gradual Duration:  3 months Timing:  Constant Progression:  Worsening Context: not burn   Relieved by:  Nothing Worsened by:  Nothing Ineffective treatments:  None tried Associated symptoms: blurred vision   Associated symptoms: no discharge, no double vision, no itching, no nausea, no redness and no vomiting   Risk factors: no conjunctival hemorrhage   Patient with HTN presents with viral symptoms and pressure behind his left L eye for several months.  Also has body aches and fever.     Past Medical History:  Diagnosis Date   Arthritis    GERD (gastroesophageal reflux disease)     Home Medications Prior to Admission medications   Medication Sig Start Date End Date Taking? Authorizing Provider  albuterol (VENTOLIN HFA) 108 (90 Base) MCG/ACT inhaler Inhale 2 puffs into the lungs every 4 (four) hours as needed for wheezing or shortness of breath. 12/01/20   Bethann Berkshire, MD  cyclobenzaprine (FLEXERIL) 10 MG tablet Take 1 tablet (10 mg total) by mouth 2 (two) times daily as needed for muscle spasms. 07/13/22   Piontek, Denny Peon, MD  lisinopril (ZESTRIL) 10 MG tablet Take 1 tablet (10 mg total) by mouth daily. 08/10/22   Georganna Skeans, MD      Allergies    Patient has no known allergies.    Review of Systems   Review of Systems  HENT:  Negative for congestion.   Eyes:  Positive for blurred vision. Negative for double vision, discharge, redness and itching.  Respiratory:  Negative for cough.   Gastrointestinal:  Negative for nausea and vomiting.  Musculoskeletal:  Positive for myalgias.  All other systems reviewed and are  negative.   Physical Exam Updated Vital Signs BP (!) 150/90 (BP Location: Right Arm)   Pulse (!) 105   Temp 100 F (37.8 C) (Oral)   Resp 18   Ht 5' 11.5" (1.816 m)   Wt 122.5 kg   SpO2 99%   BMI 37.13 kg/m  Physical Exam Vitals and nursing note reviewed.  Constitutional:      General: He is not in acute distress.    Appearance: Normal appearance. He is well-developed. He is not diaphoretic.  HENT:     Head: Normocephalic and atraumatic.     Nose: Nose normal.  Eyes:     Extraocular Movements: Extraocular movements intact.     Conjunctiva/sclera: Conjunctivae normal.     Pupils: Pupils are equal, round, and reactive to light.     Comments:   Visual Acuity  Right Eye Distance: 20/50 Left Eye Distance: 20/50 Bilateral Distance: 20/50  Right Eye Near:   Left Eye Near:    Bilateral Near:      Cardiovascular:     Rate and Rhythm: Normal rate and regular rhythm.     Pulses: Normal pulses.     Heart sounds: Normal heart sounds.  Pulmonary:     Effort: Pulmonary effort is normal.     Breath sounds: Normal breath sounds. No wheezing or rales.  Abdominal:     General: Bowel sounds are normal.     Palpations: Abdomen is soft.  Tenderness: There is no abdominal tenderness. There is no guarding or rebound.  Musculoskeletal:        General: Normal range of motion.     Cervical back: Normal range of motion and neck supple.  Skin:    General: Skin is warm and dry.     Capillary Refill: Capillary refill takes less than 2 seconds.  Neurological:     General: No focal deficit present.     Mental Status: He is alert and oriented to person, place, and time.     Deep Tendon Reflexes: Reflexes normal.  Psychiatric:        Behavior: Behavior normal.     ED Results / Procedures / Treatments   Labs (all labs ordered are listed, but only abnormal results are displayed) Labs Reviewed  RESP PANEL BY RT-PCR (RSV, FLU A&B, COVID)  RVPGX2    EKG None  Radiology No results  found.  Procedures Procedures    Medications Ordered in ED Medications  acetaminophen (TYLENOL) tablet 1,000 mg (1,000 mg Oral Given 10/04/22 2338)  ibuprofen (ADVIL) tablet 800 mg (800 mg Oral Given 10/04/22 2338)    ED Course/ Medical Decision Making/ A&P                             Medical Decision Making Viral symptoms and pressure behind L eye for several months   Amount and/or Complexity of Data Reviewed Labs: ordered.    Details: Negative covid and flu  Radiology: ordered and independent interpretation performed.    Details: Negative CT  Risk OTC drugs. Prescription drug management. Risk Details: Well appearing.  PERRL EOMI.  CT is negative will have patient follow up with ophthalmology for this ongoing issue.  Stable for discharge.      Final Clinical Impression(s) / ED Diagnoses Final diagnoses:  Viral illness   Return for intractable cough, coughing up blood, fevers > 100.4 unrelieved by medication, shortness of breath, intractable vomiting, chest pain, shortness of breath, weakness, numbness, changes in speech, facial asymmetry, abdominal pain, passing out, Inability to tolerate liquids or food, cough, altered mental status or any concerns. No signs of systemic illness or infection. The patient is nontoxic-appearing on exam and vital signs are within normal limits.  I have reviewed the triage vital signs and the nursing notes. Pertinent labs & imaging results that were available during my care of the patient were reviewed by me and considered in my medical decision making (see chart for details). After history, exam, and medical workup I feel the patient has been appropriately medically screened and is safe for discharge home. Pertinent diagnoses were discussed with the patient. Patient was given return precautions Rx / DC Orders ED Discharge Orders     None         Antonieta Slaven, MD 10/05/22 0109

## 2022-10-12 ENCOUNTER — Other Ambulatory Visit: Payer: Self-pay

## 2022-10-12 ENCOUNTER — Emergency Department (HOSPITAL_COMMUNITY)
Admission: EM | Admit: 2022-10-12 | Discharge: 2022-10-12 | Disposition: A | Discharge status: Home / Self Care | Payer: Self-pay | Attending: Emergency Medicine | Admitting: Emergency Medicine

## 2022-10-12 ENCOUNTER — Emergency Department (HOSPITAL_COMMUNITY): Payer: Self-pay

## 2022-10-12 DIAGNOSIS — M2550 Pain in unspecified joint: Secondary | ICD-10-CM

## 2022-10-12 DIAGNOSIS — R6884 Jaw pain: Secondary | ICD-10-CM | POA: Insufficient documentation

## 2022-10-12 DIAGNOSIS — J029 Acute pharyngitis, unspecified: Secondary | ICD-10-CM | POA: Insufficient documentation

## 2022-10-12 DIAGNOSIS — B9789 Other viral agents as the cause of diseases classified elsewhere: Secondary | ICD-10-CM

## 2022-10-12 DIAGNOSIS — R590 Localized enlarged lymph nodes: Secondary | ICD-10-CM | POA: Insufficient documentation

## 2022-10-12 LAB — GROUP A STREP BY PCR: Group A Strep by PCR: NOT DETECTED

## 2022-10-12 MED ORDER — ACETAMINOPHEN 500 MG PO TABS
1000.0000 mg | ORAL_TABLET | Freq: Once | ORAL | Status: AC
  Administered 2022-10-12: 1000 mg via ORAL
  Filled 2022-10-12: qty 2

## 2022-10-12 NOTE — ED Triage Notes (Addendum)
Pt reports right jaw and throat pain x few days. Pt also c/o joint pain specifically hands and feet

## 2022-10-12 NOTE — Discharge Instructions (Signed)
Please follow-up with your primary care provider regarding recent symptoms and ER visit.  Today your labs and imaging were all reassuring and I will need to talk to your primary care provider about further treatment.  You may ice your joints and continue to use Tylenol every 6 hours needed for pain if symptoms change or worsen please return to ER.

## 2022-10-12 NOTE — ED Provider Notes (Signed)
[place] EMERGENCY DEPARTMENT AT [facility] Provider Note   CSN: [id number] Arrival date & time: 10/12/22  1159     History  Chief Complaint  Patient presents with   Jaw Pain    Hayden Moore is a 32 y.o. male history of GERD, arthritis presented with right jaw pain, throat pain, bilateral hand/feet pain.  The bilateral hand and feet pain have been present for the past few years or as patient's right jaw pain and throat pain have been present for the past few days.  Patient was recently seen in the ER and diagnosed with a viral illness.  Patient's tried Tylenol and ibuprofen to no relief.  Patient still eating and drinking without issue but states that he feels that the right side of his jaw and neck are swollen.  Patient states that he thinks he is a mouth infection.  Patient also notes that he has chronic joint pain due to his arthritis but that recently his hands and knees have been hurting more than normal.  This pain is not controlled with Tylenol or ibuprofen.  Patient is able to walk but states that he feels weak in all 4 lower extremities.  Patient denied chest pain, shortness of breath, fevers, nausea/vomiting, abdominal pain, vision changes, headache, numbness, history of rheumatologic disease  Home Medications Prior to Admission medications   Medication Sig Start Date End Date Taking? Authorizing Provider  albuterol (VENTOLIN HFA) 108 (90 Base) MCG/ACT inhaler Inhale 2 puffs into the lungs every 4 (four) hours as needed for wheezing or shortness of breath. 12/01/20   Bethann Berkshire, MD  cyclobenzaprine (FLEXERIL) 10 MG tablet Take 1 tablet (10 mg total) by mouth 2 (two) times daily as needed for muscle spasms. 07/13/22   Piontek, Denny Peon, MD  lisinopril (ZESTRIL) 10 MG tablet Take 1 tablet (10 mg total) by mouth daily. 08/10/22   Georganna Skeans, MD      Allergies    Patient has no known allergies.    Review of Systems   Review of Systems See HPI Physical  Exam Updated Vital Signs BP (!) 145/108 (BP Location: Right Arm)   Pulse (!) 107   Temp 98.8 F (37.1 C) (Oral)   Resp 17   Ht 5' 11.5" (1.816 m)   Wt 122.5 kg   SpO2 98%   BMI 37.13 kg/m  Physical Exam Vitals reviewed.  Constitutional:      General: He is not in acute distress. HENT:     Head: Normocephalic and atraumatic.     Comments: No facial swelling noted    Mouth/Throat:     Lips: No lesions.     Mouth: Mucous membranes are moist. No oral lesions or angioedema.     Dentition: Normal dentition. Does not have dentures. No dental tenderness, gingival swelling, dental caries, dental abscesses or gum lesions.     Tongue: No lesions.     Palate: No lesions.     Pharynx: Oropharynx is clear. Uvula midline.     Tonsils: No tonsillar exudate or tonsillar abscesses.  Eyes:     Extraocular Movements: Extraocular movements intact.     Conjunctiva/sclera: Conjunctivae normal.     Pupils: Pupils are equal, round, and reactive to light.  Neck:     Comments: No neck swelling noted Cardiovascular:     Rate and Rhythm: Normal rate and regular rhythm.     Pulses: Normal pulses.     Heart sounds: Normal heart sounds.     Comments:  2+ bilateral radial/dorsalis pedis pulses with regular rate Pulmonary:     Effort: Pulmonary effort is normal. No respiratory distress.     Breath sounds: Normal breath sounds.  Abdominal:     Palpations: Abdomen is soft.     Tenderness: There is no abdominal tenderness. There is no guarding or rebound.  Musculoskeletal:        General: Normal range of motion.     Cervical back: Normal range of motion and neck supple.     Comments: 5 out of 5 bilateral grip/leg extension strength  Lymphadenopathy:     Cervical: Cervical adenopathy (Right-sided) present.  Skin:    General: Skin is warm and dry.     Capillary Refill: Capillary refill takes less than 2 seconds.  Neurological:     General: No focal deficit present.     Mental Status: He is alert and  oriented to person, place, and time.     Comments: Sensation intact in all 4 limbs  Psychiatric:        Mood and Affect: Mood normal.     ED Results / Procedures / Treatments   Labs (all labs ordered are listed, but only abnormal results are displayed) Labs Reviewed  GROUP A STREP BY PCR    EKG None  Radiology DG Knee Complete 4 Views Right  Result Date: 10/12/2022 CLINICAL DATA:  knee pain EXAM: RIGHT KNEE - COMPLETE 4+ VIEW COMPARISON:  None Available. FINDINGS: No acute fracture or dislocation. Joint spaces and alignment are maintained. No area of erosion or osseous destruction. No unexpected radiopaque foreign body. Soft tissues are unremarkable. IMPRESSION: 1. No acute fracture or dislocation. Electronically Signed   By: Meda Klinefelter M.D.   On: 10/12/2022 13:41   DG Hand Complete Right  Result Date: 10/12/2022 CLINICAL DATA:  Hand pain EXAM: RIGHT HAND - COMPLETE 3+ VIEW COMPARISON:  None Available. FINDINGS: No acute fracture or dislocation. Joint spaces and alignment are maintained. No area of erosion or osseous destruction. No unexpected radiopaque foreign body. Soft tissues are unremarkable. IMPRESSION: 1. No acute fracture or dislocation. Electronically Signed   By: Meda Klinefelter M.D.   On: 10/12/2022 13:40   DG Hand Complete Left  Result Date: 10/12/2022 CLINICAL DATA:  Hand pain EXAM: LEFT HAND - COMPLETE 3+ VIEW COMPARISON:  None Available. FINDINGS: No acute fracture or dislocation. Joint spaces and alignment are maintained. Small subcortical cyst at the head of the first metacarpal, likely degenerative. No definitive area of erosion or osseous destruction. No unexpected radiopaque foreign body. Soft tissues are unremarkable. IMPRESSION: 1. No acute fracture or dislocation. Electronically Signed   By: Meda Klinefelter M.D.   On: 10/12/2022 13:39   DG Knee Complete 4 Views Left  Result Date: 10/12/2022 CLINICAL DATA:  knee pain EXAM: LEFT KNEE - COMPLETE 4+  VIEW COMPARISON:  None Available. FINDINGS: No acute fracture or dislocation. Joint spaces and alignment are maintained. No area of erosion or osseous destruction. No unexpected radiopaque foreign body. Soft tissues are unremarkable. IMPRESSION: 1. No acute fracture or dislocation. Electronically Signed   By: Meda Klinefelter M.D.   On: 10/12/2022 13:37    Procedures Procedures    Medications Ordered in ED Medications  acetaminophen (TYLENOL) tablet 1,000 mg (1,000 mg Oral Given 10/12/22 1259)    ED Course/ Medical Decision Making/ A&P  Medical Decision Making Amount and/or Complexity of Data Reviewed Radiology: ordered.  Risk OTC drugs.   Bastion Giere 32 y.o. presented today for right jaw pain, throat pain, arthralgias. Working DDx that I considered at this time includes, but not limited to, viral illness, strep pharyngitis, rheumatoid arthritis, arthritis, PTA, RPA.  R/o DDx: strep pharyngitis, rheumatoid arthritis, arthritis, PTA, RPA: These are considered less likely due to history of present illness and physical exam findings  Review of prior external notes: 10/05/2022 ED provider  Unique Tests and My Interpretation:  Group A strep: Negative Right knee x-ray:No acute osseous changes Left knee x-ray: No acute osseous changes Right hand x-ray:No acute osseous changes Left hand x-ray:No acute osseous changes  Discussion with Independent Historian: None  Discussion of Management of Tests: None  Risk: Medium: prescription drug management  Risk Stratification Score: none  Plan: Patient presented for multiple complaints. On exam patient was no acute distress and stable vitals.  Patient's physical exam was unremarkable and patient did not have any oral lesions or abnormalities concerning for infection.  Patient's face and neck appeared symmetrical and patient had good grip strength and knee strength.  Patient does note he has history of  arthritis but states this feels different.  Patient was diagnosed with viral illness a few days ago which all this may be an exacerbation of however due to patient's insistence x-rays were ordered of both hands and both knees as he states that is where the pain is worse.  Strep panel was ordered as patient's respiratory panel was negative the other day.  Due to patient's reassuring mouth and neck exam I do not see reason for a CT of patient's neck at this time.  Patient does have right cervical adenopathy noted however this could be due to viral illness.  Patient be given Tylenol and is stable at this time.  Patient strep was reassuring along with his x-rays.  I suspect patient's pain is most likely secondary to a viral illness and encouraged to follow-up with his primary care provider to further discuss long-term treatment if symptoms persist.  Patient was given work note and verbalized his acceptance of this plan.  Patient was given return precautions. Patient stable for discharge at this time.  Patient verbalized understanding of plan.         Final Clinical Impression(s) / ED Diagnoses Final diagnoses:  Arthralgia, unspecified joint  Sore throat (viral)    Rx / DC Orders ED Discharge Orders     None         Remi Deter 10/12/22 1404    Gerhard Munch, MD 10/12/22 1541

## 2022-10-25 ENCOUNTER — Other Ambulatory Visit: Payer: Self-pay

## 2022-10-25 ENCOUNTER — Ambulatory Visit
Admission: EM | Admit: 2022-10-25 | Discharge: 2022-10-25 | Disposition: A | Discharge status: Home / Self Care | Payer: Self-pay | Attending: Family Medicine | Admitting: Family Medicine

## 2022-10-25 ENCOUNTER — Encounter: Payer: Self-pay | Admitting: Emergency Medicine

## 2022-10-25 DIAGNOSIS — I10 Essential (primary) hypertension: Secondary | ICD-10-CM

## 2022-10-25 DIAGNOSIS — M2550 Pain in unspecified joint: Secondary | ICD-10-CM

## 2022-10-25 HISTORY — DX: Essential (primary) hypertension: I10

## 2022-10-25 MED ORDER — PREDNISONE 20 MG PO TABS: ORAL_TABLET | ORAL | 0 refills | Status: AC

## 2022-10-25 NOTE — ED Provider Notes (Signed)
[facility]    CSN: [id number] Arrival date & time: 10/25/22  1544      History   Chief Complaint Chief Complaint  Patient presents with   Generalized Body Aches    HPI Hayden Moore is a 32 y.o. male.   HPI Patient with a history of GERD and Arthritis presents today with ongoing arthralgias. Seen at [facility] ED for similar complaint however, had a sore throat at the time, and symptoms were thought to be viral in nature.  Patient reports arthralgias have been present for several years and he has not had any diagnostic work-up to determine the underlying cause of recurrent joint patient. Patient endorses a history of routine alcohol intake, but endorses he has cutback significantly on the amount of alcohol he consumes. Reports no family history involving first degree relatives with Lupus, RA or any other known autoimmune disorders. Reports due to chronic joint pain and chronic back pain he has been unable to maintain consistent employment and keep health benefits. Patient is established with Primary care at [facility] and recently missed a follow-up appointment which he intends to reschedule. He is not currently having any other viral symptoms, fever, or any other concerns today. Patient's BP is elevated today and he reports taking BP medication prior to arrival [facility] at [facility].  Past Medical History:  Diagnosis Date   Arthritis    GERD (gastroesophageal reflux disease)    Hypertension     Patient Active Problem List   Diagnosis Date Noted   TOBACCO USER 03/17/2009   TINEA CORPORIS 03/16/2009   PENILE PAIN 02/10/2009   OBESITY 04/09/2008   TINEA VERSICOLOR 02/12/2007   HYPERTENSION, BENIGN SYSTEMIC 06/22/2006    History reviewed. No pertinent surgical history.     Home Medications    Prior to Admission medications   Medication Sig Start Date End Date Taking? Authorizing Provider  predniSONE (DELTASONE) 20 MG tablet Take 3 tablets (60 mg total) by mouth daily with  breakfast for 3 days, THEN 2 tablets (40 mg total) daily with breakfast for 2 days, THEN 1 tablet (20 mg total) daily with breakfast for 2 days, THEN 0.5 tablets (10 mg total) daily with breakfast for 2 days. 10/25/22 11/03/22 Yes Bing Neighbors, NP  albuterol (VENTOLIN HFA) 108 (90 Base) MCG/ACT inhaler Inhale 2 puffs into the lungs every 4 (four) hours as needed for wheezing or shortness of breath. 12/01/20   Bethann Berkshire, MD  cyclobenzaprine (FLEXERIL) 10 MG tablet Take 1 tablet (10 mg total) by mouth 2 (two) times daily as needed for muscle spasms. 07/13/22   Piontek, Denny Peon, MD  lisinopril (ZESTRIL) 10 MG tablet Take 1 tablet (10 mg total) by mouth daily. 08/10/22   Georganna Skeans, MD    Family History Family History  Family history unknown: Yes    Social History Social History   Tobacco Use   Smoking status: Every Day    Packs/day: .5    Types: Cigarettes   Smokeless tobacco: Never  Vaping Use   Vaping Use: Never used  Substance Use Topics   Alcohol use: Yes   Drug use: No     Allergies   Patient has no known allergies.   Review of Systems Review of Systems Pertinent negatives listed in HPI  Physical Exam Triage Vital Signs ED Triage Vitals  Enc Vitals Group     BP      Pulse      Resp      Temp  Temp src      SpO2      Weight      Height      Head Circumference      Peak Flow      Pain Score      Pain Loc      Pain Edu?      Excl. in GC?    No data found.  Updated Vital Signs BP (!) 169/85 (BP Location: Left Arm)   Pulse (!) 101   Temp 98.4 F (36.9 C) (Oral)   Resp 18   SpO2 96%   Visual Acuity Right Eye Distance:   Left Eye Distance:   Bilateral Distance:    Right Eye Near:   Left Eye Near:    Bilateral Near:     Physical Exam Constitutional:      Appearance: Normal appearance.  HENT:     Head: Normocephalic and atraumatic.     Nose: Nose normal.  Eyes:     Extraocular Movements: Extraocular movements intact.      Conjunctiva/sclera: Conjunctivae normal.     Pupils: Pupils are equal, round, and reactive to light.  Cardiovascular:     Rate and Rhythm: Normal rate and regular rhythm.  Pulmonary:     Effort: Pulmonary effort is normal.     Breath sounds: Normal breath sounds.  Musculoskeletal:     Right hand: No swelling. Decreased range of motion. Normal strength. Normal capillary refill. Normal pulse.     Left hand: No swelling. Decreased range of motion. Normal strength. Normal capillary refill. Normal pulse.     Cervical back: Normal range of motion and neck supple.     Comments: Negative for localized hand or joint swelling. Negative for joint nodules or visible deformities of joints, hands, or digits  Neurological:     General: No focal deficit present.     Mental Status: He is alert.      UC Treatments / Results  Labs (all labs ordered are listed, but only abnormal results are displayed) Labs Reviewed - No data to display  EKG   Radiology No results found.  Procedures Procedures (including critical care time)  Medications Ordered in UC Medications - No data to display  Initial Impression / Assessment and Plan / UC Course  I have reviewed the triage vital signs and the nursing notes.  Pertinent labs & imaging results that were available during my care of the patient were reviewed by me and considered in my medical decision making (see chart for details).    Arthralgia,unknown etiology, will trial a prednisone taper. Encouraged follow-up with PCP for further work-up and evaluation of chronic arthralgias. Discussed elevated BP encouraged to re-schedule follow-up with PCP for ongoing monitoring and management of hypertension.  Final Clinical Impressions(s) / UC Diagnoses   Final diagnoses:  Arthralgia, unspecified joint  Elevated blood pressure reading in office with diagnosis of hypertension   Discharge Instructions   None    ED Prescriptions     Medication Sig Dispense  Auth. Provider   predniSONE (DELTASONE) 20 MG tablet Take 3 tablets (60 mg total) by mouth daily with breakfast for 3 days, THEN 2 tablets (40 mg total) daily with breakfast for 2 days, THEN 1 tablet (20 mg total) daily with breakfast for 2 days, THEN 0.5 tablets (10 mg total) daily with breakfast for 2 days. 16 tablet Bing Neighbors, NP      PDMP not reviewed this encounter.   Bing Neighbors, NP  10/30/22 1016  

## 2022-10-25 NOTE — ED Notes (Signed)
Pt sts was in argument just pta and now is calmer; rechecked HR and informed NP

## 2022-10-25 NOTE — ED Triage Notes (Signed)
Pt sts joint pain x several weeks getting more severe; pt seen for same without diagnosis; pt sts some mouth pain as well

## 2022-10-31 ENCOUNTER — Other Ambulatory Visit: Payer: Self-pay | Admitting: Family Medicine

## 2022-11-03 ENCOUNTER — Telehealth: Payer: Self-pay | Admitting: Physician Assistant

## 2022-11-03 DIAGNOSIS — M2550 Pain in unspecified joint: Secondary | ICD-10-CM

## 2022-11-03 MED ORDER — MELOXICAM 15 MG PO TABS: 15.0000 mg | ORAL_TABLET | Freq: Every day | ORAL | 2 refills | Status: DC

## 2022-11-03 NOTE — Progress Notes (Signed)
Virtual Visit Consent   Hayden Moore, you are scheduled for a virtual visit with a [facility] provider today. Just as with appointments in the office, your consent must be obtained to participate. Your consent will be active for this visit and any virtual visit you may have with one of our providers in the next 365 days. If you have a MyChart account, a copy of this consent can be sent to you electronically.  As this is a virtual visit, video technology does not allow for your provider to perform a traditional examination. This may limit your provider's ability to fully assess your condition. If your provider identifies any concerns that need to be evaluated in person or the need to arrange testing (such as labs, EKG, etc.), we will make arrangements to do so. Although advances in technology are sophisticated, we cannot ensure that it will always work on either your end or our end. If the connection with a video visit is poor, the visit may have to be switched to a telephone visit. With either a video or telephone visit, we are not always able to ensure that we have a secure connection.  By engaging in this virtual visit, you consent to the provision of healthcare and authorize for your insurance to be billed (if applicable) for the services provided during this visit. Depending on your insurance coverage, you may receive a charge related to this service.  I need to obtain your verbal consent now. Are you willing to proceed with your visit today? Hayden Moore has provided verbal consent on 11/03/2022 for a virtual visit (video or telephone). Margaretann Loveless, PA-C  Date: 11/03/2022 5:06 PM  Virtual Visit via Video Note   I, Margaretann Loveless, connected with  Hayden Moore  ([id number], 1991-01-08) on 11/03/22 at  5:00 PM EDT by a video-enabled telemedicine application and verified that I am speaking with the correct person using two identifiers.  Location: Patient: Virtual Visit Location  Patient: Home Provider: Virtual Visit Location Provider: Home Office   I discussed the limitations of evaluation and management by telemedicine and the availability of in person appointments. The patient expressed understanding and agreed to proceed.    History of Present Illness: Hayden Moore is a 32 y.o. who identifies as a male who was assigned male at birth, and is being seen today for having continued "extreme joint pain and stiffness". Was seen at ER on 10/25/22. Was given Prednisone with immense relief in symptoms, but symptoms returned as soon as he stopped the medication. Requesting further treatment and a work note. He is currently uninsured and working on getting Medicaid (already applied) so that he can start getting work up for this issue.    Problems:  Patient Active Problem List   Diagnosis Date Noted   TOBACCO USER 03/17/2009   TINEA CORPORIS 03/16/2009   PENILE PAIN 02/10/2009   OBESITY 04/09/2008   TINEA VERSICOLOR 02/12/2007   HYPERTENSION, BENIGN SYSTEMIC 06/22/2006    Allergies: No Known Allergies Medications:  Current Outpatient Medications:    meloxicam (MOBIC) 15 MG tablet, Take 1 tablet (15 mg total) by mouth daily., Disp: 30 tablet, Rfl: 2   albuterol (VENTOLIN HFA) 108 (90 Base) MCG/ACT inhaler, Inhale 2 puffs into the lungs every 4 (four) hours as needed for wheezing or shortness of breath., Disp: 8 g, Rfl: 0   cyclobenzaprine (FLEXERIL) 10 MG tablet, Take 1 tablet (10 mg total) by mouth 2 (two) times daily as needed for muscle spasms., Disp:  20 tablet, Rfl: 0   lisinopril (ZESTRIL) 10 MG tablet, TAKE 1 TABLET BY MOUTH EVERY DAY, Disp: 90 tablet, Rfl: 0   predniSONE (DELTASONE) 20 MG tablet, Take 3 tablets (60 mg total) by mouth daily with breakfast for 3 days, THEN 2 tablets (40 mg total) daily with breakfast for 2 days, THEN 1 tablet (20 mg total) daily with breakfast for 2 days, THEN 0.5 tablets (10 mg total) daily with breakfast for 2 days., Disp: 16 tablet,  Rfl: 0  Observations/Objective: Patient is well-developed, well-nourished in no acute distress.  Resting comfortably at home.  Head is normocephalic, atraumatic.  No labored breathing.  Speech is clear and coherent with logical content.  Patient is alert and oriented at baseline.    Assessment and Plan: 1. Arthralgia, unspecified joint - meloxicam (MOBIC) 15 MG tablet; Take 1 tablet (15 mg total) by mouth daily.  Dispense: 30 tablet; Refill: 2  - Discussed risk of long-term and repeated corticosteroid use, such as Prednisone - Will try Meloxicam daily - May add in Tylenol as needed - Heat - Epsom salt soaks - Follow up in person if symptoms worsen or fail to improve  Follow Up Instructions: I discussed the assessment and treatment plan with the patient. The patient was provided an opportunity to ask questions and all were answered. The patient agreed with the plan and demonstrated an understanding of the instructions.  A copy of instructions were sent to the patient via MyChart unless otherwise noted below.    The patient was advised to call back or seek an in-person evaluation if the symptoms worsen or if the condition fails to improve as anticipated.  Time:  I spent 10 minutes with the patient via telehealth technology discussing the above problems/concerns.    Margaretann Loveless, PA-C

## 2022-11-03 NOTE — Patient Instructions (Signed)
Therisa Doyne, thank you for joining Margaretann Loveless, PA-C for today's virtual visit.  While this provider is not your primary care provider (PCP), if your PCP is located in our provider database this encounter information will be shared with them immediately following your visit.   A [facility] MyChart account gives you access to today's visit and all your visits, tests, and labs performed at [facility] " click here if you don't have a [facility] MyChart account or go to [email or web address]  Consent: (Patient) Hayden Moore provided verbal consent for this virtual visit at the beginning of the encounter.  Current Medications:  Current Outpatient Medications:    meloxicam (MOBIC) 15 MG tablet, Take 1 tablet (15 mg total) by mouth daily., Disp: 30 tablet, Rfl: 2   albuterol (VENTOLIN HFA) 108 (90 Base) MCG/ACT inhaler, Inhale 2 puffs into the lungs every 4 (four) hours as needed for wheezing or shortness of breath., Disp: 8 g, Rfl: 0   cyclobenzaprine (FLEXERIL) 10 MG tablet, Take 1 tablet (10 mg total) by mouth 2 (two) times daily as needed for muscle spasms., Disp: 20 tablet, Rfl: 0   lisinopril (ZESTRIL) 10 MG tablet, TAKE 1 TABLET BY MOUTH EVERY DAY, Disp: 90 tablet, Rfl: 0   predniSONE (DELTASONE) 20 MG tablet, Take 3 tablets (60 mg total) by mouth daily with breakfast for 3 days, THEN 2 tablets (40 mg total) daily with breakfast for 2 days, THEN 1 tablet (20 mg total) daily with breakfast for 2 days, THEN 0.5 tablets (10 mg total) daily with breakfast for 2 days., Disp: 16 tablet, Rfl: 0   Medications ordered in this encounter:  Meds ordered this encounter  Medications   meloxicam (MOBIC) 15 MG tablet    Sig: Take 1 tablet (15 mg total) by mouth daily.    Dispense:  30 tablet    Refill:  2    Order Specific Question:   Supervising Provider    Answer:   Merrilee Jansky [id number]     *If you need refills on other medications prior to your next appointment,  please contact your pharmacy*  Follow-Up: Call back or seek an in-person evaluation if the symptoms worsen or if the condition fails to improve as anticipated.  [facility] [phone]  Other Instructions Joint Pain Joint pain may be caused by many things. Joint pain is likely to go away when you follow instructions from your health care provider for relieving pain at home. However, joint pain can also be caused by conditions that require more treatment. Common causes of joint pain include: Bruising in the area of the joint. Injury caused by repeating certain movements too many times. Age-related joint wear and tear. Buildup of uric acid crystals in the joint (gout). Inflammation of the joint. Other forms of arthritis. Infections of the joint or of the bone. Your health care provider may recommend that you take pain medicine or wear a supportive device like an elastic bandage, sling, or splint. If your joint pain continues, you may need lab or imaging tests to diagnose the cause of your joint pain. Follow these instructions at home: Managing pain, stiffness, and swelling     If directed, put ice on the painful area. To do this: If you have a removable elastic bandage, sling, or splint, take it off as told by your doctor. Put ice in a plastic bag. Place a towel between your skin and the bag. Leave the ice on for 20 minutes,  2-3 times a day. Remove the ice if your skin turns bright red. This is very important. If you cannot feel pain, heat, or cold, you have a greater risk of damage to the area. Move your fingers and toes often to reduce stiffness and swelling. Raise the injured area above the level of your heart while you are sitting or lying down. If directed, apply heat to the painful area as often as told by your health care provider. Use the heat source that your health care provider recommends, such as a moist heat pack or a heating pad. Place a towel between your  skin and the heat source. Leave the heat on for 20-30 minutes. Remove the heat if your skin turns bright red. This is especially important if you are unable to feel pain, heat, or cold. You have a greater risk of getting burned.  Activity Rest as told by your health care provider. Do not do anything that causes or worsens pain. Begin exercising or stretching the affected area as told by your health care provider. Return to your normal activities as told by your health care provider. Ask your health care provider what activities are safe for you. If you have an elastic bandage, sling, or splint: Wear the bandage, sling, or splint as told by your health care provider. Remove it only as told by your health care provider. Loosen it if your fingers or toes below the joint tingle, become numb, or turn cold and blue. Keep it clean. Ask your health care provider if you should remove it before bathing. If the bandage, sling, or splint is not waterproof: Do not let it get wet. Cover it with a watertight covering when you take a bath or shower. General instructions Treatment may include medicines for pain and inflammation that are taken by mouth or applied to the skin. Take over-the-counter and prescription medicines only as told by your health care provider. Do not use any products that contain nicotine or tobacco, such as cigarettes, e-cigarettes, and chewing tobacco. If you need help quitting, ask your health care provider. Keep all follow-up visits. This is important. Contact a health care provider if: You have pain that gets worse and does not get better with medicine. Your joint pain does not improve within 3 days. You have increased bruising or swelling. You have a fever. You lose 10 lb (4.5 kg) or more without trying. Get help right away if: You cannot move the joint. Your fingers or toes tingle, become numb, or turn cold and blue. You have a fever along with a joint that is red, warm, and  swollen. Summary Joint pain may be caused by many things. Your health care provider may recommend that you take pain medicine or wear a supportive device such as an elastic bandage, sling, or splint. If your joint pain continues, you may need tests to diagnose the cause of your joint pain. Take over-the-counter and prescription medicines only as told by your health care provider. This information is not intended to replace advice given to you by your health care provider. Make sure you discuss any questions you have with your health care provider. Document Revised: 07/23/2019 Document Reviewed: 07/24/2019 Elsevier Patient Education  2024 Elsevier Inc.    If you have been instructed to have an in-person evaluation today at a local Urgent Care facility, please use the link below. It will take you to a list of all of our available [facility], including address, phone number  and hours of operation. Please do not delay care.  [facility]  If you or a family member do not have a primary care provider, use the link below to schedule a visit and establish care. When you choose a [facility] primary care physician or advanced practice provider, you gain a long-term partner in health. Find a Primary Care Provider  Learn more about [facility] in-office and virtual care options: [facility] Get Care Now

## 2022-11-19 ENCOUNTER — Emergency Department (HOSPITAL_COMMUNITY)
Admission: EM | Admit: 2022-11-19 | Discharge: 2022-11-19 | Disposition: A | Discharge status: Home / Self Care | Payer: MEDICAID | Attending: Emergency Medicine | Admitting: Emergency Medicine

## 2022-11-19 ENCOUNTER — Encounter (HOSPITAL_COMMUNITY): Payer: Self-pay

## 2022-11-19 ENCOUNTER — Other Ambulatory Visit: Payer: Self-pay

## 2022-11-19 ENCOUNTER — Emergency Department (HOSPITAL_COMMUNITY): Payer: MEDICAID

## 2022-11-19 DIAGNOSIS — S62307A Unspecified fracture of fifth metacarpal bone, left hand, initial encounter for closed fracture: Secondary | ICD-10-CM | POA: Insufficient documentation

## 2022-11-19 DIAGNOSIS — W228XXA Striking against or struck by other objects, initial encounter: Secondary | ICD-10-CM | POA: Insufficient documentation

## 2022-11-19 NOTE — Progress Notes (Signed)
Orthopedic Tech Progress Note Patient Details:  Hayden Moore Apr 23, 1991 [id number]  Ortho Devices Type of Ortho Device: Ulna gutter splint Ortho Device/Splint Location: LUE Ortho Device/Splint Interventions: Ordered, Application, Adjustment   Post Interventions Patient Tolerated: Well Instructions Provided: Care of device, Adjustment of device Spoke with ortho MD Steward Drone who is on call and he advised me to do a splint over a cast due to the patients hand still being swollen. Darleen Crocker 11/19/2022, 8:13 AM

## 2022-11-19 NOTE — ED Triage Notes (Signed)
Pt had injury from work on left hand - hit it 2x on glass door.  Happened Wednesday - still sore and swollen.

## 2022-11-19 NOTE — Discharge Instructions (Addendum)
You have a fracture of your left fifth metacarpal.  You have been placed in a cast today.  You need to follow-up with orthopedics in 4 weeks.  Please call the orthopedic office below, or an orthopedic office of your choice, to schedule an appointment.   Please keep your cast dry.  You may cover it with a garbage bag or Saran wrap when taking a shower.  Return to the ER if you develop numbness or tingling in your fingers, severe pain in your hand, any other new or concerning symptoms.

## 2022-11-19 NOTE — ED Provider Notes (Signed)
[place] EMERGENCY DEPARTMENT AT [facility] Provider Note   CSN: [id number] Arrival date & time: 11/19/22  0347     History  Chief Complaint  Patient presents with   Hand Injury    Hayden Moore is a 32 y.o. male who presents with concern of left pinky finger pain that started 4 days ago.  He punched a glass door and then started to have pain on his left hand on the pinky finger side.  States it is harder to move his finger, but has sensation intact.   Hand Injury      Home Medications Prior to Admission medications   Medication Sig Start Date End Date Taking? Authorizing Provider  albuterol (VENTOLIN HFA) 108 (90 Base) MCG/ACT inhaler Inhale 2 puffs into the lungs every 4 (four) hours as needed for wheezing or shortness of breath. 12/01/20   Bethann Berkshire, MD  cyclobenzaprine (FLEXERIL) 10 MG tablet Take 1 tablet (10 mg total) by mouth 2 (two) times daily as needed for muscle spasms. 07/13/22   Piontek, Denny Peon, MD  lisinopril (ZESTRIL) 10 MG tablet TAKE 1 TABLET BY MOUTH EVERY DAY 10/31/22   Rema Fendt, NP  meloxicam (MOBIC) 15 MG tablet Take 1 tablet (15 mg total) by mouth daily. 11/03/22   Margaretann Loveless, PA-C      Allergies    Patient has no known allergies.    Review of Systems   Review of Systems  Physical Exam Updated Vital Signs BP (!) 142/80   Pulse (!) 122   Temp 98.6 F (37 C)   Resp 16   Ht 5\' 11"  (1.803 m)   Wt 122.5 kg   SpO2 95%   BMI 37.66 kg/m  Physical Exam Vitals and nursing note reviewed.  Constitutional:      Appearance: Normal appearance.  HENT:     Head: Atraumatic.  Pulmonary:     Effort: Pulmonary effort is normal.  Musculoskeletal:     Comments: Mild edema over the left fifth metacarpal.  No obvious deformity, erythema, wounds. Tender to palpation over the distal left fifth meta carpal.  No tenderness palpation over the left fifth phalanges Patient has limited extension of the left fifth MCP, full range of motion  of the left fifth PIP and DIP.  Left hand neurovascularly intact  Skin:    General: Skin is warm and dry.  Neurological:     General: No focal deficit present.     Mental Status: He is alert.  Psychiatric:        Mood and Affect: Mood normal.        Behavior: Behavior normal.     ED Results / Procedures / Treatments   Labs (all labs ordered are listed, but only abnormal results are displayed) Labs Reviewed - No data to display  EKG None  Radiology DG Hand Complete Left  Result Date: 11/19/2022 CLINICAL DATA:  32 year old male with history of injury at work. Pain and swelling. EXAM: LEFT HAND - COMPLETE 3+ VIEW COMPARISON:  No priors. FINDINGS: Three views of the left hand demonstrate an acute comminuted minimally displaced and mildly angulated fracture of the distal fifth metacarpal. On the lateral projection there is approximately 2 mm of anterior displacement, and approximately 10-15 degrees of anterior/radial angulation. Other bones appear intact. Extensive soft tissue swelling surrounding the fracture. IMPRESSION: 1. Acute mildly comminuted, mildly displaced and angulated fracture of the fifth metacarpal, as above. Electronically Signed   By: Brayton Mars.D.  On: 11/19/2022 07:35    Procedures Procedures    Medications Ordered in ED Medications - No data to display  ED Course/ Medical Decision Making/ A&P                             Medical Decision Making Amount and/or Complexity of Data Reviewed Radiology: ordered.   32 y.o. male resents to the ED for concern of left hand pinky finger pain after punching a glass door  Differential diagnosis includes but is not limited to fracture, dislocation, soft tissue injury  ED Course:  Patient has no obvious wounds laceration to the left hand.  He does have swelling over the left fifth metacarpal and tenderness upon palpation.  Was found to have a mildly comminuted, mildly displaced and angulated fracture of the  fifth metacarpal.  He was placed in a short arm cast and was instructed to follow-up with orthopedics in 4 weeks for repeat evaluation.   Impression: Fifth metacarpal fracture  Disposition:  The patient was discharged home in cast with instructions to follow-up with orthopedics in 4 weeks Return precautions given.  Imaging Studies ordered: I ordered imaging studies including x-ray left hand I independently visualized the imaging with scope of interpretation limited to determining acute life threatening conditions related to emergency care. Imaging showed mildly comminuted, mildly displaced and related fracture of the fifth metacarpal I agree with the radiologist interpretation             Final Clinical Impression(s) / ED Diagnoses Final diagnoses:  Closed fracture of fifth metacarpal bone of left hand, unspecified fracture morphology, initial encounter    Rx / DC Orders ED Discharge Orders     None         Arabella Merles, PA-C 11/19/22 8413    Laurence Spates, MD 11/19/22 [phone]

## 2022-12-07 ENCOUNTER — Emergency Department (HOSPITAL_COMMUNITY)
Admission: EM | Admit: 2022-12-07 | Discharge: 2022-12-07 | Disposition: A | Discharge status: Home / Self Care | Payer: Self-pay | Attending: Emergency Medicine | Admitting: Emergency Medicine

## 2022-12-07 ENCOUNTER — Encounter (HOSPITAL_COMMUNITY): Payer: Self-pay

## 2022-12-07 ENCOUNTER — Other Ambulatory Visit: Payer: Self-pay

## 2022-12-07 ENCOUNTER — Emergency Department (HOSPITAL_COMMUNITY): Payer: Self-pay

## 2022-12-07 ENCOUNTER — Ambulatory Visit (HOSPITAL_BASED_OUTPATIENT_CLINIC_OR_DEPARTMENT_OTHER): Payer: Self-pay | Admitting: Orthopaedic Surgery

## 2022-12-07 DIAGNOSIS — N451 Epididymitis: Secondary | ICD-10-CM | POA: Insufficient documentation

## 2022-12-07 DIAGNOSIS — Z79899 Other long term (current) drug therapy: Secondary | ICD-10-CM | POA: Insufficient documentation

## 2022-12-07 DIAGNOSIS — S62337G Displaced fracture of neck of fifth metacarpal bone, left hand, subsequent encounter for fracture with delayed healing: Secondary | ICD-10-CM | POA: Insufficient documentation

## 2022-12-07 DIAGNOSIS — I10 Essential (primary) hypertension: Secondary | ICD-10-CM | POA: Insufficient documentation

## 2022-12-07 DIAGNOSIS — X58XXXA Exposure to other specified factors, initial encounter: Secondary | ICD-10-CM | POA: Insufficient documentation

## 2022-12-07 LAB — URINALYSIS, ROUTINE W REFLEX MICROSCOPIC
Bacteria, UA: NONE SEEN
Bilirubin Urine: NEGATIVE
Glucose, UA: NEGATIVE mg/dL
Ketones, ur: 20 mg/dL — AB
Leukocytes,Ua: NEGATIVE
Nitrite: NEGATIVE
Protein, ur: 30 mg/dL — AB
Specific Gravity, Urine: 1.016 (ref 1.005–1.030)
pH: 6 (ref 5.0–8.0)

## 2022-12-07 MED ORDER — DOXYCYCLINE HYCLATE 100 MG PO TABS
100.0000 mg | ORAL_TABLET | Freq: Once | ORAL | Status: AC
  Administered 2022-12-07: 100 mg via ORAL
  Filled 2022-12-07: qty 1

## 2022-12-07 MED ORDER — CEFTRIAXONE SODIUM 500 MG IJ SOLR
500.0000 mg | Freq: Once | INTRAMUSCULAR | Status: AC
  Administered 2022-12-07: 500 mg via INTRAMUSCULAR
  Filled 2022-12-07: qty 500

## 2022-12-07 MED ORDER — KETOROLAC TROMETHAMINE 15 MG/ML IJ SOLN
15.0000 mg | Freq: Once | INTRAMUSCULAR | Status: AC
  Administered 2022-12-07: 15 mg via INTRAMUSCULAR
  Filled 2022-12-07: qty 1

## 2022-12-07 MED ORDER — DOXYCYCLINE HYCLATE 100 MG PO CAPS: 100.0000 mg | ORAL_CAPSULE | Freq: Two times a day (BID) | ORAL | 0 refills | Status: AC

## 2022-12-07 MED ORDER — LIDOCAINE HCL (PF) 1 % IJ SOLN
INTRAMUSCULAR | Status: AC
  Administered 2022-12-07: 1 mL
  Filled 2022-12-07: qty 5

## 2022-12-07 MED ORDER — ACETAMINOPHEN 500 MG PO TABS
1000.0000 mg | ORAL_TABLET | Freq: Once | ORAL | Status: AC
  Administered 2022-12-07: 1000 mg via ORAL
  Filled 2022-12-07: qty 2

## 2022-12-07 NOTE — ED Provider Notes (Signed)
[place] EMERGENCY DEPARTMENT AT [facility] Provider Note   CSN: [id number] Arrival date & time: 12/07/22  1141     History  No chief complaint on file.   Hayden Moore is a 32 y.o. male with PMH as listed below who presents for recheck of broken L hand; took splint off toda that was applied on 11/19/2022.  He still is having pain and swelling in the area of his left fifth metacarpal.  He was supposed to have an appointment with orthopedics today but he did not go and instead presented to the emergency department due to the cost of the co-pay which she could not cover.  Additionally, patient noticed that last week he began to have pain of the right testicle and noticed a bump on it.  He states he thinks it is increasing in size.  Denies any penile discharge, burning with urination, hematuria, redness or wounds on the scrotum.  He has no history of similar.  No fevers or chills.  Is sexually active unprotected with 1 male but would like to be tested for STDs.    Past Medical History:  Diagnosis Date   Arthritis    GERD (gastroesophageal reflux disease)    Hypertension        Home Medications Prior to Admission medications   Medication Sig Start Date End Date Taking? Authorizing Provider  doxycycline (VIBRAMYCIN) 100 MG capsule Take 1 capsule (100 mg total) by mouth 2 (two) times daily for 7 days. 12/07/22 12/14/22 Yes Loetta Rough, MD  albuterol (VENTOLIN HFA) 108 (90 Base) MCG/ACT inhaler Inhale 2 puffs into the lungs every 4 (four) hours as needed for wheezing or shortness of breath. 12/01/20   Bethann Berkshire, MD  cyclobenzaprine (FLEXERIL) 10 MG tablet Take 1 tablet (10 mg total) by mouth 2 (two) times daily as needed for muscle spasms. 07/13/22   Piontek, Denny Peon, MD  lisinopril (ZESTRIL) 10 MG tablet TAKE 1 TABLET BY MOUTH EVERY DAY 10/31/22   Rema Fendt, NP  meloxicam (MOBIC) 15 MG tablet Take 1 tablet (15 mg total) by mouth daily. 11/03/22   Margaretann Loveless,  PA-C      Allergies    Patient has no known allergies.    Review of Systems   Review of Systems A 10 point review of systems was performed and is negative unless otherwise reported in HPI.  Physical Exam Updated Vital Signs BP (!) 135/91 (BP Location: Right Arm)   Pulse 63   Temp 98.4 F (36.9 C) (Oral)   Resp 14   SpO2 99%  Physical Exam General: Normal appearing male, lying in bed.  HEENT: Sclera anicteric, MMM, trachea midline.  Cardiology: RRR, no murmurs/rubs/gallops. BL radial pulses equal bilaterally.  Resp: Normal respiratory rate and effort. CTAB, no wheezes, rhonchi, crackles.  Abd: Soft, non-tender, non-distended. No rebound tenderness or guarding.  GU: Performed with male nurse chaperone per patient request.  Normal-appearing circumcised genitalia.  1.5 x 1.5 cm mass palpated on the right testicle with mild tenderness palpation.  No overlying skin changes, scrotal edema or erythema.  No urethral discharge or blood noted. MSK: Swelling to the area of the distal left fifth metacarpal with tenderness palpation.  No overlying erythema, induration, crepitus.  Good range of motion of the hand however does have limited full grip strength of the left fifth digit due to pain. Neurovascularly intact.  Skin: warm, dry. Neuro: A&Ox4, CNs II-XII grossly intact. MAEs. Sensation grossly intact.  Psych: Normal mood  and affect.   ED Results / Procedures / Treatments   Labs (all labs ordered are listed, but only abnormal results are displayed) Labs Reviewed  URINALYSIS, ROUTINE W REFLEX MICROSCOPIC - Abnormal; Notable for the following components:      Result Value   APPearance HAZY (*)    Hgb urine dipstick SMALL (*)    Ketones, ur 20 (*)    Protein, ur 30 (*)    All other components within normal limits  GC/CHLAMYDIA PROBE AMP ([place]) NOT AT [facility]    EKG None  Radiology US Scrotum  Result Date: 12/07/2022 CLINICAL DATA:  Testicular lump EXAM: ULTRASOUND OF SCROTUM  TECHNIQUE: Complete ultrasound examination of the testicles, epididymis, and other scrotal structures was performed. COMPARISON:  None Available. FINDINGS: Right testicle Measurements: 4.1 x 2.3 x 3.1 cm. No mass or microlithiasis visualized. Left testicle Measurements: 3.4 x 2.2 x 2.6 cm. No mass or microlithiasis visualized. Right epididymis: Enlarged and heterogeneous with mildly increased vascularity. Left epididymis:  Normal in size and appearance. Hydrocele:  None visualized. Varicocele:  None visualized. Other: Nonspecific 1.1 cm calcification with posterior acoustic shadowing was identified lateral to the right testis. No inguinal hernia was seen. IMPRESSION: 1. Enlarged and heterogeneous right epididymis with mildly increased vascularity, suggestive of epididymitis. 2. Nonspecific 1.1 cm calcification with posterior acoustic shadowing lateral to the right testis. This is of uncertain etiology and clinical significance. 3. No intratesticular mass. Electronically Signed   By: Duanne Guess D.O.   On: 12/07/2022 18:03    Procedures Procedures    Medications Ordered in ED Medications  acetaminophen (TYLENOL) tablet 1,000 mg (1,000 mg Oral Given 12/07/22 1649)  ketorolac (TORADOL) 15 MG/ML injection 15 mg (15 mg Intramuscular Given 12/07/22 1650)  cefTRIAXone (ROCEPHIN) injection 500 mg (500 mg Intramuscular Given 12/07/22 1831)  doxycycline (VIBRA-TABS) tablet 100 mg (100 mg Oral Given 12/07/22 1827)  lidocaine (PF) (XYLOCAINE) 1 % injection (1 mL  Given 12/07/22 1832)    ED Course/ Medical Decision Making/ A&P                          Medical Decision Making Amount and/or Complexity of Data Reviewed Labs: ordered. Radiology: ordered. Decision-making details documented in ED Course.  Risk OTC drugs. Prescription drug management.     MDM:    For the testicular lump, consider acute epididymitis or epididymo-orchitis, varicocele, hydrocele.  Lower c/f inguinal hernia, mumps orchitis,  testicular cancer. No c/f testicular torsion based on history/exam. No scrotal abscess or cellulitis.  Ultrasound demonstrated right epididymitis.  Patient denies any unprotected anal intercourse, do not need to cover for enteric pathogens. Will treat for GC with ceftriaxone IM x 1 and doxycycline x 7 days.  G/C culture drawn and will follow-up via MyChart in the next 1 to 2 days.  Also with a calcification of the right testis of uncertain clinical significance. Discussed w/ patient. Recommended follow-up with primary care physician within 1-2 weeks.  Hand w/ re-demonstrated L 5th metacarpal boxer's fracture. Splint placed again, NVI pre- and post splint placement. Recommended Tylenol/ibuprofen and ice/elevation at home.  Recommended f/u with orthopedic surgery for casting and assessment within 1-2 weeks. Instructed to leave splint on until can be seen by ortho.    Clinical Course as of 12/09/22 1518  Wed Dec 07, 2022  1604 DG Hand Complete Left No significant change in alignment of the 5th metacarpal neck fracture. Decreased soft tissue swelling.  [HN]  1810  US Scrotum 1. Enlarged and heterogeneous right epididymis with mildly increased vascularity, suggestive of epididymitis. 2. Nonspecific 1.1 cm calcification with posterior acoustic shadowing lateral to the right testis. This is of uncertain etiology and clinical significance. 3. No intratesticular mass.   [HN]    Clinical Course User Index [HN] Loetta Rough, MD    Labs: I Ordered, and personally interpreted labs.  The pertinent results include:  UA w/ 20 ketones, 30 protein, but no infection  Imaging Studies ordered: I ordered imaging studies including US scrotum, XR L hand I independently visualized and interpreted imaging. I agree with the radiologist interpretation  Additional history obtained from chart review.    Reevaluation: After the interventions noted above, I reevaluated the patient and found that they have  :improved  Social Determinants of Health: Lives independently  Disposition:  DC w/ discharge instructions/return precautions. All questions answered to patient's satisfaction.    Co morbidities that complicate the patient evaluation  Past Medical History:  Diagnosis Date   Arthritis    GERD (gastroesophageal reflux disease)    Hypertension      Medicines Meds ordered this encounter  Medications   acetaminophen (TYLENOL) tablet 1,000 mg   ketorolac (TORADOL) 15 MG/ML injection 15 mg   cefTRIAXone (ROCEPHIN) injection 500 mg    Order Specific Question:   Antibiotic Indication:    Answer:   STD   doxycycline (VIBRA-TABS) tablet 100 mg   lidocaine (PF) (XYLOCAINE) 1 % injection    Grant Fontana : cabinet override   doxycycline (VIBRAMYCIN) 100 MG capsule    Sig: Take 1 capsule (100 mg total) by mouth 2 (two) times daily for 7 days.    Dispense:  14 capsule    Refill:  0    I have reviewed the patients home medicines and have made adjustments as needed  Problem List / ED Course: Problem List Items Addressed This Visit   None Visit Diagnoses     Epididymitis, right    -  Primary   Closed displaced fracture of neck of fifth metacarpal bone of left hand with delayed healing, subsequent encounter                       This note was created using dictation software, which may contain spelling or grammatical errors.    Loetta Rough, MD 12/09/22 [phone]

## 2022-12-07 NOTE — Discharge Instructions (Addendum)
Thank you for coming to [facility] Emergency Department. You were seen for hand pain, testicular pain. We did an exam, labs, and imaging, and these showed continued fracture of the left fifth metacarpal also called a boxer's fracture.  A splint was placed and you do need to follow-up with orthopedic surgery.  Please call to reschedule appointment for within the next 1 to 2 weeks. We also found epididymitis of the right testicle.  You were treated in the ED with antibiotics and will take doxycycline for 7 days twice a day.  You have lab results that you can follow-up on your MyChart within the next 1 to 2 days.  There was also a calcification on the right testicle noted on the ultrasound that is of uncertain clinical significance. Please follow up with your primary care provider within 1 week.  Attached is a sheet of low-cost primary care physicians. You can alternate taking Tylenol and ibuprofen as needed for pain. You can take 650mg  tylenol (acetaminophen) every 4-6 hours, and 600 mg ibuprofen 3 times a day. Ice/elevation can help the hand swelling/pain.  Do not hesitate to return to the ED or call 911 if you experience: -Worsening symptoms -Lightheadedness, passing out -Fevers/chills -Anything else that concerns you

## 2022-12-07 NOTE — ED Triage Notes (Signed)
Pt here for recheck of broken L hand; took cast off today; endorses pain; PMS intact; pt also endorsing lump of testicle x 10 days, increasing in size; denies discharge, endorses some pain; denies fevers

## 2022-12-07 NOTE — Progress Notes (Signed)
Orthopedic Tech Progress Note Patient Details:  Hayden Moore Sep 08, 1990 [id number]  Ortho Devices Type of Ortho Device: Cotton web roll, Volar splint Ortho Device/Splint Location: LUE Ortho Device/Splint Interventions: Ordered, Application, Adjustment   Post Interventions Patient Tolerated: Well Instructions Provided: Care of device  Hayden Moore 12/07/2022, 5:08 PM

## 2022-12-08 LAB — GC/CHLAMYDIA PROBE AMP (~~LOC~~) NOT AT ARMC
Chlamydia: NEGATIVE
Comment: NEGATIVE
Comment: NORMAL
Neisseria Gonorrhea: NEGATIVE

## 2022-12-09 ENCOUNTER — Ambulatory Visit: Payer: Self-pay | Admitting: *Deleted

## 2022-12-09 NOTE — Telephone Encounter (Signed)
Summary: med management   Pt stated he was recently seen at ED and is requesting to speak with the nurse as he was prescribed medication, but his tests were negative, and he is unsure if he should take the medication.  Seeking clinical advice.      Called patient [phone] to review medication questions and review if any sx reported prior to stopping medication prescribed from ED visit. No answer, LVMTCB [phone].

## 2022-12-09 NOTE — Telephone Encounter (Signed)
  Chief Complaint: medication question Symptoms: Reported at ED: pt also endorsing lump of testicle x 10 days, increasing in size; denies discharge, endorses some pain; denies fevers    Disposition: [] ED /[] Urgent Care (no appt availability in office) / [] Appointment(In office/virtual)/ []  [place] [facility]/ [] Home Care/ [] Refused Recommended Disposition /[] [place] [facility]/ [x]  Follow-up with PCP Additional Notes: Patient reports his STD testing came back negative -should he take medication prescribed- advised due to symptom- yes. If prescribed by provider then take the medication for current symptoms- still present.    Reason for Disposition  [1] Caller has URGENT medicine question about med that PCP or specialist prescribed AND [2] triager unable to answer question  Answer Assessment - Initial Assessment Questions 1. NAME of MEDICINE: "What medicine(s) are you calling about?" doxycycline (VIBRA-TABS) tablet 100 mg   Once  2. QUESTION: "What is your question?" (e.g., double dose of medicine, side effect)      Patient states he was given antibiotic at ED visit- but his tests can back negative- does he still need to take the medication? 3. PRESCRIBER: "Who prescribed the medicine?" Reason: if prescribed by specialist, call should be referred to that group.     ED provider 4. SYMPTOMS: "Do you have any symptoms?" If Yes, ask: "What symptoms are you having?"  "How bad are the symptoms (e.g., mild, moderate, severe)     Lump in scrotum still present- patient advised yes- take the medication- may help symptoms.  Protocols used: Medication Question Call-A-AH

## 2022-12-26 IMAGING — CT CT ABD-PELV W/ CM
2 of 4 series · 16 of 46 positions shown, 18 images · IV contrast (omnipaque)
Comparison: None.

CLINICAL DATA: Left lower quadrant abdominal pain for 2 days.

EXAM:
CT ABDOMEN AND PELVIS WITH CONTRAST
TECHNIQUE: Multidetector CT imaging of the abdomen and pelvis was performed
using the standard protocol following bolus administration of
intravenous contrast.
CONTRAST:  100mL OMNIPAQUE IOHEXOL 300 MG/ML  SOLN

[Series 2: axial st · axial · 0.83mm/px · z∈[+1134,+1574]mm · 13 of 102 slices shown, 15 images]
[im 7/102  soft-tissue]
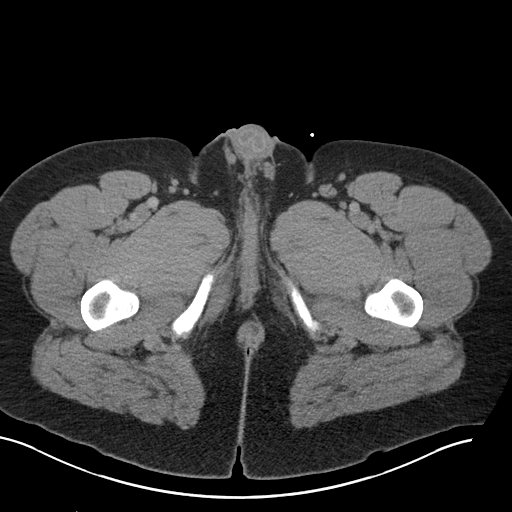
[im 7/102  bone]
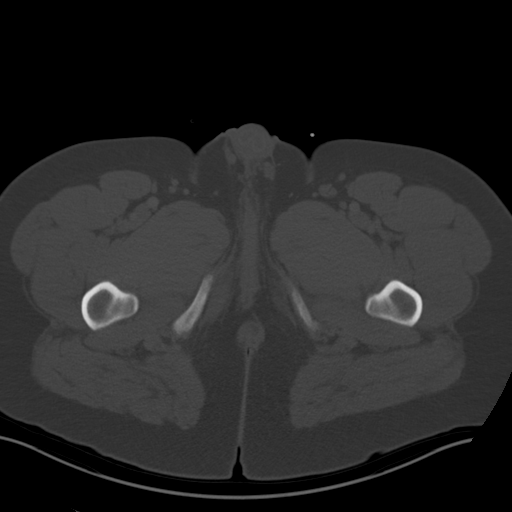
[im 13/102  soft-tissue]
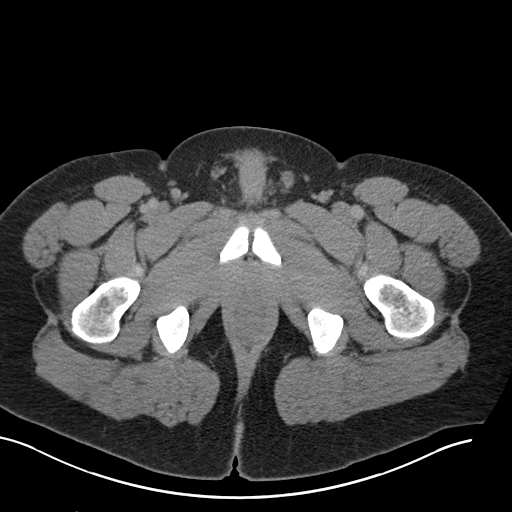
[im 19/102  soft-tissue]
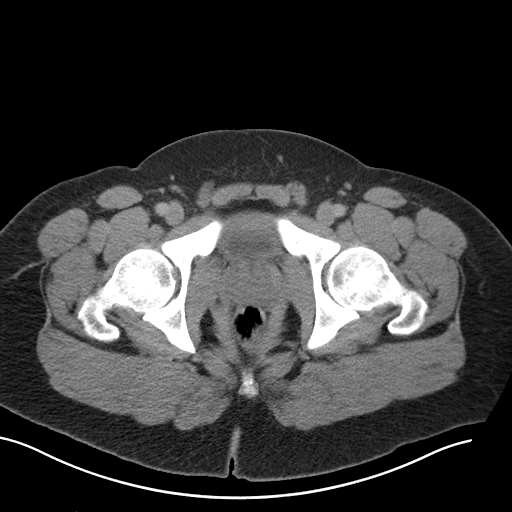
[im 32/102  soft-tissue]
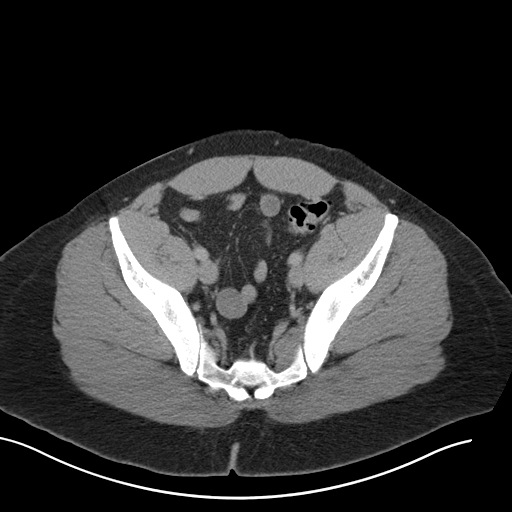
[im 38/102  soft-tissue]
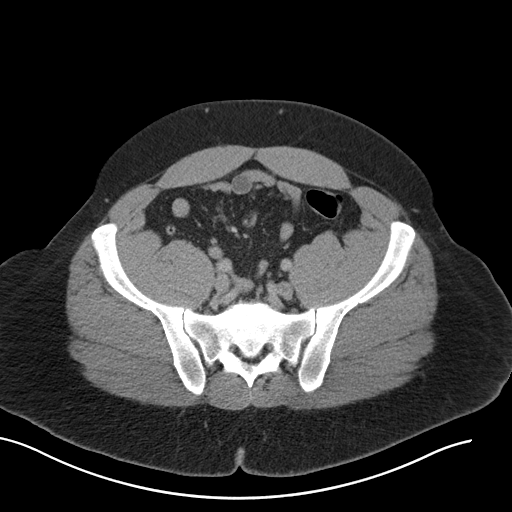
[im 45/102  soft-tissue]
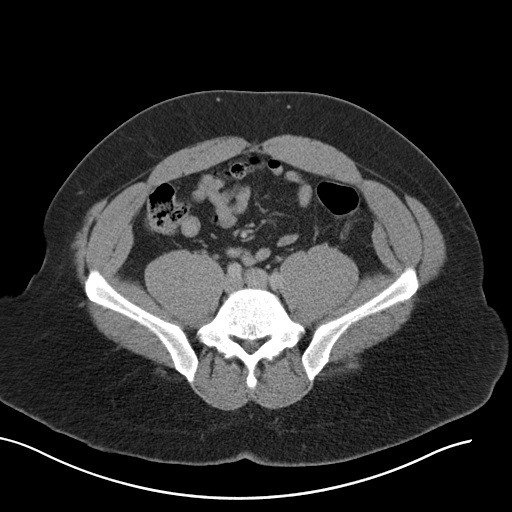
[im 51/102  soft-tissue]
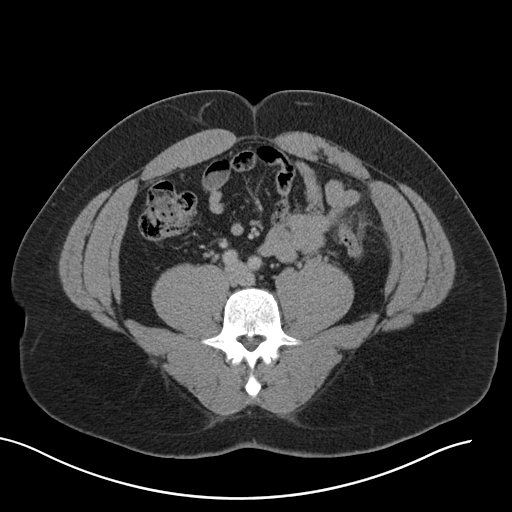
[im 57/102  soft-tissue]
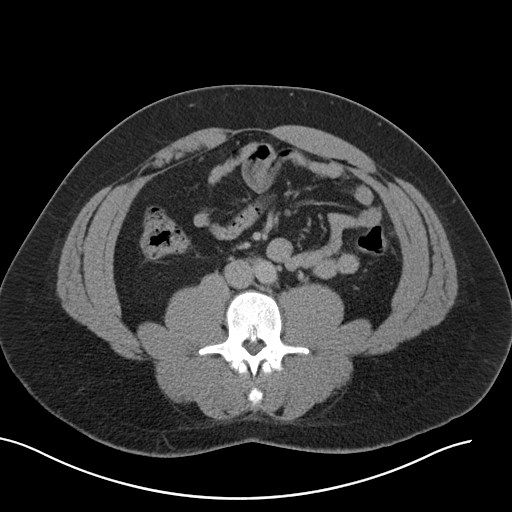
[im 64/102  soft-tissue]
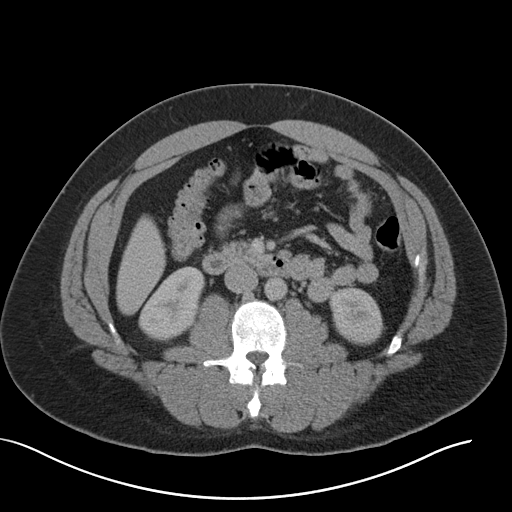
[im 64/102  bone]
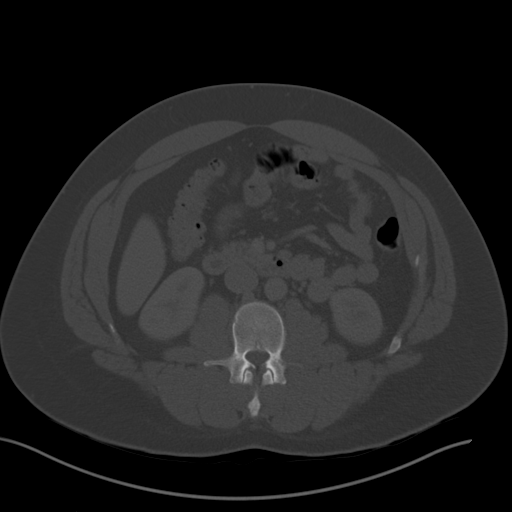
[im 70/102  soft-tissue]
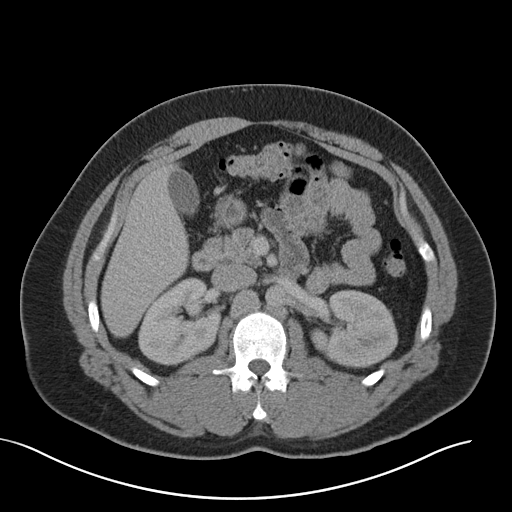
[im 83/102  soft-tissue]
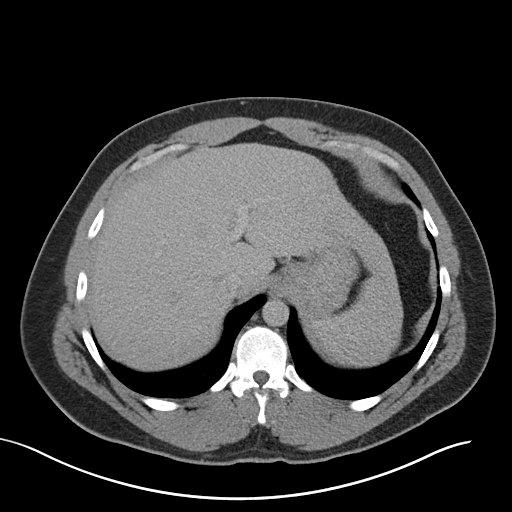
[im 89/102  soft-tissue]
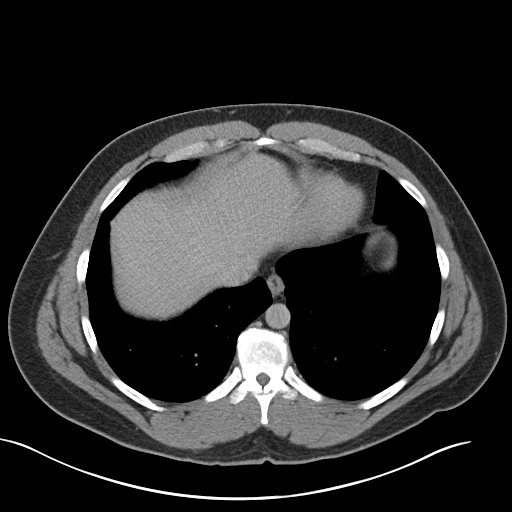
[im 95/102  soft-tissue]
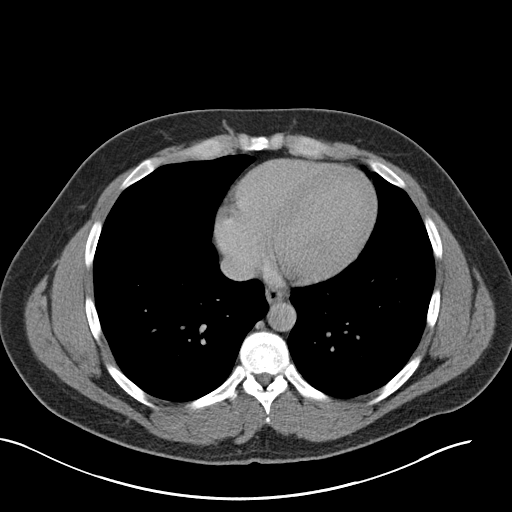

[Series 4: coronal st · coronal · 0.96mm/px · 3 of 151 slices shown]
[im 51/151  soft-tissue]
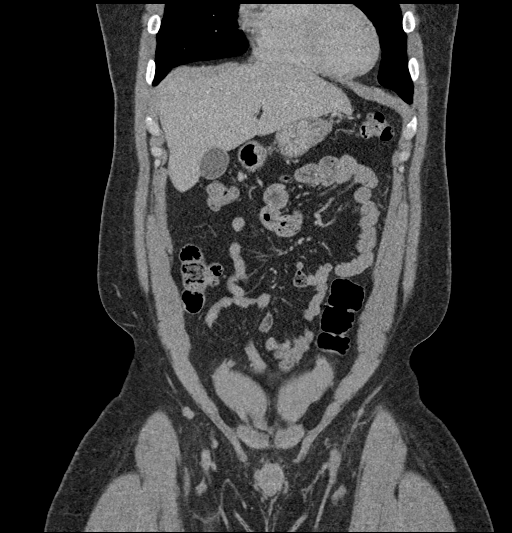
[im 67/151  soft-tissue]
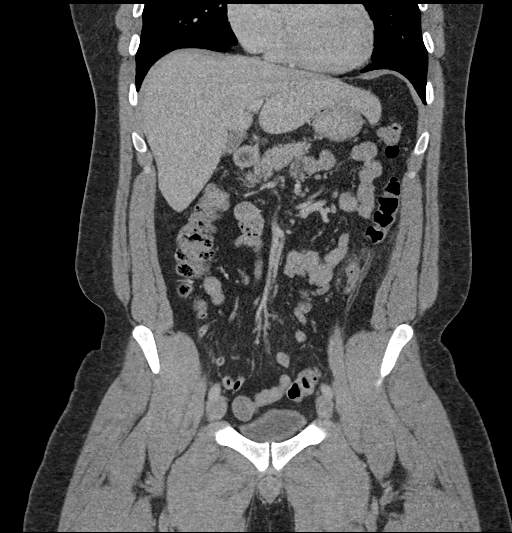
[im 84/151  soft-tissue]
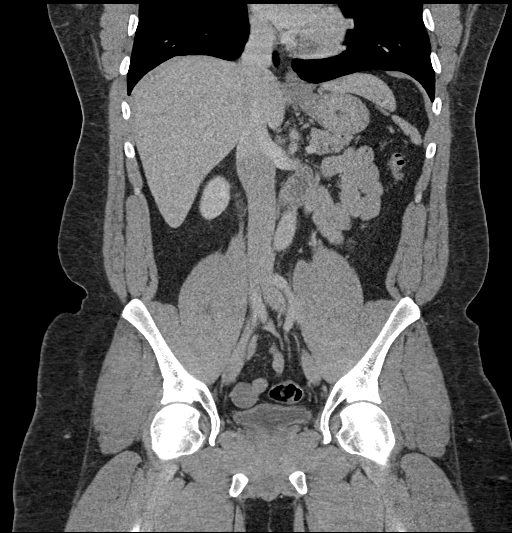

[16 of 46 positions shown; findings below may reference images not displayed]

FINDINGS: Lower chest: Unremarkable.

Hepatobiliary: No suspicious focal abnormality within the liver
parenchyma. There is no evidence for gallstones, gallbladder wall
thickening, or pericholecystic fluid. No intrahepatic or
extrahepatic biliary dilation.

Pancreas: No focal mass lesion. No dilatation of the main duct. No
intraparenchymal cyst. No peripancreatic edema.

Spleen: No splenomegaly. No focal mass lesion.

Adrenals/Urinary Tract: No adrenal nodule or mass. Kidneys
unremarkable. No evidence for hydroureter. The urinary bladder
appears normal for the degree of distention.

Stomach/Bowel: Stomach is nondistended. Duodenum is normally
positioned as is the ligament of Treitz. No small bowel wall
thickening. No small bowel dilatation. The terminal ileum is normal.
The appendix is normal. There is a small focus of edema/inflammation
along the distal descending colon without background diverticulosis.
Coronal imaging shows oval ring like component to the
edema/inflammation, characteristic for epiploic appendagitis (see
coronal 62/4, also noted on sagittal 156/5).

Vascular/Lymphatic: No abdominal aortic aneurysm. No abdominal
aortic atherosclerotic calcification. There is no gastrohepatic or
hepatoduodenal ligament lymphadenopathy. No retroperitoneal or
mesenteric lymphadenopathy. No pelvic sidewall lymphadenopathy.

Reproductive: The prostate gland and seminal vesicles are
unremarkable.

Other: No intraperitoneal free fluid.

Musculoskeletal: No worrisome lytic or sclerotic osseous
abnormality.
IMPRESSION: 1. Small focus of edema/inflammation along the distal descending
colon without background diverticulosis. Imaging features are most
characteristic for epiploic appendagitis.
2. Otherwise unremarkable exam.

## 2023-01-22 ENCOUNTER — Emergency Department (HOSPITAL_COMMUNITY): Payer: Self-pay

## 2023-01-22 ENCOUNTER — Inpatient Hospital Stay (HOSPITAL_COMMUNITY): Payer: Self-pay

## 2023-01-22 ENCOUNTER — Other Ambulatory Visit: Payer: Self-pay

## 2023-01-22 ENCOUNTER — Encounter (HOSPITAL_COMMUNITY): Payer: Self-pay

## 2023-01-22 ENCOUNTER — Inpatient Hospital Stay (HOSPITAL_COMMUNITY)
Admission: EM | Admit: 2023-01-22 | Discharge: 2023-01-25 | DRG: 253 | Disposition: A | Discharge status: Home / Self Care | Payer: Self-pay | Attending: Internal Medicine | Admitting: Internal Medicine

## 2023-01-22 DIAGNOSIS — Z6834 Body mass index (BMI) 34.0-34.9, adult: Secondary | ICD-10-CM

## 2023-01-22 DIAGNOSIS — R202 Paresthesia of skin: Secondary | ICD-10-CM | POA: Diagnosis present

## 2023-01-22 DIAGNOSIS — F419 Anxiety disorder, unspecified: Secondary | ICD-10-CM | POA: Diagnosis present

## 2023-01-22 DIAGNOSIS — Z86718 Personal history of other venous thrombosis and embolism: Secondary | ICD-10-CM

## 2023-01-22 DIAGNOSIS — I70228 Atherosclerosis of native arteries of extremities with rest pain, other extremity: Principal | ICD-10-CM | POA: Diagnosis present

## 2023-01-22 DIAGNOSIS — E669 Obesity, unspecified: Secondary | ICD-10-CM | POA: Diagnosis present

## 2023-01-22 DIAGNOSIS — F17210 Nicotine dependence, cigarettes, uncomplicated: Secondary | ICD-10-CM | POA: Diagnosis present

## 2023-01-22 DIAGNOSIS — Z1152 Encounter for screening for COVID-19: Secondary | ICD-10-CM

## 2023-01-22 DIAGNOSIS — I10 Essential (primary) hypertension: Secondary | ICD-10-CM | POA: Diagnosis present

## 2023-01-22 DIAGNOSIS — Z791 Long term (current) use of non-steroidal anti-inflammatories (NSAID): Secondary | ICD-10-CM

## 2023-01-22 DIAGNOSIS — E876 Hypokalemia: Secondary | ICD-10-CM | POA: Diagnosis not present

## 2023-01-22 DIAGNOSIS — R0989 Other specified symptoms and signs involving the circulatory and respiratory systems: Principal | ICD-10-CM

## 2023-01-22 DIAGNOSIS — M1990 Unspecified osteoarthritis, unspecified site: Secondary | ICD-10-CM | POA: Diagnosis present

## 2023-01-22 DIAGNOSIS — Z579 Occupational exposure to unspecified risk factor: Secondary | ICD-10-CM

## 2023-01-22 DIAGNOSIS — Z832 Family history of diseases of the blood and blood-forming organs and certain disorders involving the immune mechanism: Secondary | ICD-10-CM

## 2023-01-22 DIAGNOSIS — R000 Tachycardia, unspecified: Secondary | ICD-10-CM | POA: Diagnosis present

## 2023-01-22 DIAGNOSIS — R634 Abnormal weight loss: Secondary | ICD-10-CM | POA: Diagnosis present

## 2023-01-22 DIAGNOSIS — H547 Unspecified visual loss: Secondary | ICD-10-CM | POA: Diagnosis present

## 2023-01-22 DIAGNOSIS — I742 Embolism and thrombosis of arteries of the upper extremities: Secondary | ICD-10-CM | POA: Diagnosis present

## 2023-01-22 DIAGNOSIS — K219 Gastro-esophageal reflux disease without esophagitis: Secondary | ICD-10-CM | POA: Diagnosis present

## 2023-01-22 DIAGNOSIS — Z79899 Other long term (current) drug therapy: Secondary | ICD-10-CM

## 2023-01-22 DIAGNOSIS — R590 Localized enlarged lymph nodes: Secondary | ICD-10-CM | POA: Diagnosis present

## 2023-01-22 LAB — CBC WITH DIFFERENTIAL/PLATELET
Abs Immature Granulocytes: 0.01 10*3/uL (ref 0.00–0.07)
Basophils Absolute: 0 10*3/uL (ref 0.0–0.1)
Basophils Relative: 1 %
Eosinophils Absolute: 0.2 10*3/uL (ref 0.0–0.5)
Eosinophils Relative: 4 %
HCT: 40.8 % (ref 39.0–52.0)
Hemoglobin: 13.6 g/dL (ref 13.0–17.0)
Immature Granulocytes: 0 %
Lymphocytes Relative: 21 %
Lymphs Abs: 1.2 10*3/uL (ref 0.7–4.0)
MCH: 30.2 pg (ref 26.0–34.0)
MCHC: 33.3 g/dL (ref 30.0–36.0)
MCV: 90.5 fL (ref 80.0–100.0)
Monocytes Absolute: 0.8 10*3/uL (ref 0.1–1.0)
Monocytes Relative: 14 %
Neutro Abs: 3.3 10*3/uL (ref 1.7–7.7)
Neutrophils Relative %: 60 %
Platelets: 287 10*3/uL (ref 150–400)
RBC: 4.51 MIL/uL (ref 4.22–5.81)
RDW: 14 % (ref 11.5–15.5)
WBC: 5.6 10*3/uL (ref 4.0–10.5)
nRBC: 0 % (ref 0.0–0.2)

## 2023-01-22 LAB — COMPREHENSIVE METABOLIC PANEL
ALT: 48 U/L — ABNORMAL HIGH (ref 0–44)
AST: 53 U/L — ABNORMAL HIGH (ref 15–41)
Albumin: 3.1 g/dL — ABNORMAL LOW (ref 3.5–5.0)
Alkaline Phosphatase: 93 U/L (ref 38–126)
Anion gap: 11 (ref 5–15)
BUN: 10 mg/dL (ref 6–20)
CO2: 24 mmol/L (ref 22–32)
Calcium: 8.9 mg/dL (ref 8.9–10.3)
Chloride: 103 mmol/L (ref 98–111)
Creatinine, Ser: 1.16 mg/dL (ref 0.61–1.24)
GFR, Estimated: >60 mL/min (ref >60)
Glucose, Bld: 98 mg/dL (ref 70–99)
Potassium: 3.5 mmol/L (ref 3.5–5.1)
Sodium: 138 mmol/L (ref 135–145)
Total Bilirubin: 1 mg/dL (ref 0.3–1.2)
Total Protein: 6.7 g/dL (ref 6.5–8.1)

## 2023-01-22 LAB — PROTIME-INR
INR: 1.1 (ref 0.8–1.2)
Prothrombin Time: 14.4 s (ref 11.4–15.2)

## 2023-01-22 MED ORDER — IOHEXOL 350 MG/ML SOLN
125.0000 mL | Freq: Once | INTRAVENOUS | Status: AC | PRN
  Administered 2023-01-22: 125 mL via INTRAVENOUS

## 2023-01-22 MED ORDER — SODIUM CHLORIDE 0.9 % IV BOLUS
1000.0000 mL | Freq: Once | INTRAVENOUS | Status: AC
  Administered 2023-01-22: 1000 mL via INTRAVENOUS

## 2023-01-22 MED ORDER — HEPARIN (PORCINE) 25000 UT/250ML-% IV SOLN
1900.0000 [IU]/h | INTRAVENOUS | Status: DC
  Administered 2023-01-22: 1600 [IU]/h via INTRAVENOUS
  Administered 2023-01-23 (×2): 1900 [IU]/h via INTRAVENOUS
  Filled 2023-01-22 (×3): qty 250

## 2023-01-22 MED ORDER — HEPARIN BOLUS VIA INFUSION
5500.0000 [IU] | Freq: Once | INTRAVENOUS | Status: AC
  Administered 2023-01-22: 5500 [IU] via INTRAVENOUS
  Filled 2023-01-22: qty 5500

## 2023-01-22 MED ORDER — SODIUM CHLORIDE 0.9% FLUSH
3.0000 mL | Freq: Two times a day (BID) | INTRAVENOUS | Status: DC
  Administered 2023-01-23 – 2023-01-25 (×4): 3 mL via INTRAVENOUS

## 2023-01-22 NOTE — H&P (Incomplete)
History and Physical    Hayden Moore YNW:[id number] DOB: [birth year] DOA: 01/22/2023  PCP: Georganna Skeans, MD   Patient coming from: Home   Chief Complaint:  Chief Complaint  Patient presents with  . Hand Pain    HPI:  Hayden Moore is a 32 y.o. 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  Reports onset Thursday evening which woke him from sleep.  Severe pain mostly distally across all fingers, and in the medial palm.  Associated with this right hand was cold, worse distally.  Has hypoesthesia/paresthesia across all fingertips but worse in the fourth and fifth digit.  He has no history of prior venous or arterial thromboembolism.  No history of trauma to the arm.  Does report a history of blood clots in his younger brother who is on indefinite anticoagulation.  He has no further details about this.  His history is otherwise notable for longstanding inflammatory sounding arthritis.  Reports potentially being worked up in the past, although no records in system.  He has stiffness in the small joints of his hands which last for few hours in the morning, other joint pains worst in the small joints of his feet, elbows, knees, lower back.  Reports an episode of visual loss involving the left eye which she was seen in the emergency department here, appears possibly this was in 6/' 24.  Vision is now close to back to normal.  Denies skin manifestations, has eczema.  Does have a history of reflux.  Sometimes experiences anxiety but reports this is manifested with heart racing.  No known history of an arrhythmia. reports family history of inflammatory arthritis in his grandmother who has rheumatoid arthritis.  Notably he does not have insurance which is why he has not followed up about some of his more chronic medical problems.   Review of Systems:  ROS complete and negative except as  marked above   No Known Allergies  Prior to Admission medications   Medication Sig Start Date End Date Taking? Authorizing Provider  lisinopril (ZESTRIL) 10 MG tablet TAKE 1 TABLET BY MOUTH EVERY DAY 10/31/22  Yes Zonia Kief, Amy J, NP  meloxicam (MOBIC) 15 MG tablet Take 1 tablet (15 mg total) by mouth daily. 11/03/22  Yes Margaretann Loveless, PA-C    Past Medical History:  Diagnosis Date  . Arthritis   . GERD (gastroesophageal reflux disease)   . Hypertension     History reviewed. No pertinent surgical history.   reports that he has been smoking cigarettes. He has never used smokeless tobacco. He reports current alcohol use. He reports that he does not use drugs.  Family History  Family history unknown: Yes     Physical Exam: Vitals:   01/22/23 2100 01/22/23 2130 01/22/23 2200 01/22/23 2230  BP: 135/78 130/81 (!) 147/79 (!) 141/93  Pulse: 62 63 82 68  Resp: (!) 27 (!) 23 (!) 25 (!) 22  Temp:      TempSrc:      SpO2: 100% 100% 100% 100%  Weight:      Height:        Gen: Awake, alert, NAD   CV: Regular, normal S1, S2, no murmurs, right radial pulses 1+, left radial pulses 2+ Resp: Normal WOB, CTAB  Abd: Flat, normoactive, nontender MSK: Possible slight skin thickening/tightening distally in the fingertips bilaterally.  No ulceration distally.  The left hand is swollen,  reports from recent boxers fracture.  Appears to be some synovitis in the small joints of the hand. Skin: There is small punctate area of petechiae in the volar aspect of the right fifth digit on the pad of the finger.  No other rashes or lesions to exposed skin Neuro: Alert and interactive, in the right upper extremity motor function is intact in the hand.  There is hypoesthesia over the medial right elbow, and in the hand especially in the fourth and fifth digit.  Psych: euthymic, appropriate    Data review:   Labs reviewed, notable for:   Blood counts are normal including differential INR is normal,  APTT is pending K3.5 AST 53, ALT 48 other LFTs normal  Micro:  Results for orders placed or performed during the hospital encounter of 10/12/22  Group A Strep by PCR     Status: None   Collection Time: 10/12/22  1:01 PM   Specimen: Throat; Sterile Swab  Result Value Ref Range Status   Group A Strep by PCR NOT DETECTED NOT DETECTED Final    Comment: Performed at [facility], [address], [place], [place] [address]    Imaging reviewed:  CT ANGIO UP EXTREM RIGHT W &/OR WO CONTRAST  Addendum Date: 01/22/2023   ADDENDUM REPORT: 01/22/2023 16:38 ADDENDUM: Findings reported by telephone to Dr. Wilkie Aye, 4:35 p.m. Electronically Signed   By: Jearld Lesch M.D.   On: 01/22/2023 16:38   Result Date: 01/22/2023 CLINICAL DATA:  Diminished pulses, claudication * Tracking Code: BO * EXAM: CT ANGIOGRAPHY UPPER RIGHT EXTREMITY TECHNIQUE: CT angiogram of the right upper extremity was obtained in the arterial phase of contrast administration. CONTRAST:  OMNIPAQUE IOHEXOL 350 MG/ML SOLN COMPARISON:  None Available. FINDINGS: Vascular: Normal contour and caliber of the right upper extremity vasculature. No significant atherosclerosis. 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 The vessel remains occluded through the division of the radial, ulnar, and interosseous arteries, with reconstitution of the branch vessels below the elbow (series 12, image 27). Branch vessels remain patent to the wrist. Vessels of the hand and palmar arch are not well appreciated, probably due to early phase of contrast. Osseous and musculoskeletal: No fracture or dislocation. Soft tissues unremarkable. Partially included chest: Right hilar and mediastinal lymphadenopathy (series 18,  image 78). Lower cervical and supraclavicular lymphadenopathy. Heterogeneous airspace disease throughout the partially imaged right lung. Review of the MIP images confirms the above findings. IMPRESSION: 1. 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 2. 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 The vessel remains occluded through the division of the radial, ulnar, and interosseous arteries, with reconstitution of the branch vessels below the elbow. 3. Branch vessels of the right forearm remain patent to the wrist. Vessels of the hand and palmar arch are not well appreciated, probably due to early phase of contrast. 4. Right hilar, mediastinal, and cervical lymphadenopathy, concerning for malignancy. 5. Heterogeneous airspace disease throughout the partially imaged right lung. Consider dedicated imaging of the chest. Call report request was placed at the time of interpretation. Report issued at this time in the interest of expedient communication. Final communication of critical  findings will be documented. Electronically Signed: By: Jearld Lesch M.D. On: 01/22/2023 16:31    EKG:  Pending  ED Course:  Imaging obtained including CTA findings above, vascular surgery was consulted.  Patient was started on a heparin drip with improvement in his right arm symptoms.  Tentative plan from vascular surgery for OR possible embolectomy tomorrow.   Assessment/Plan:  32 y.o. 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  Found to have possible dissection versus thrombus right subclavian artery, suspected arterial thromboembolism in the brachial artery  Arterial thromboembolism right upper extremity, possible dissection 3-day history of cold painful  right hand and sensory loss distally.  CTA in the ED demonstrating focal nonocclusive thrombus or dissection flap in the right subclavian artery and occlusion of the distal right brachial artery with distal reconstitution felt to be secondary to a thrombus.  Vascular surgery has been consulted feel consistent with right upper extremity thrombus.  He has been started on heparin drip and symptoms are improving in the ED.      Body mass index is 34.44 kg/m. ***   DVT prophylaxis:  {Blank single:19197::"Lovenox","SQ Heparin","IV heparin gtts","Xarelto","Eliquis","Coumadin","SCDs","***"} Code Status:  {Blank single:19197::"Full Code","DNR with Intubation","DNR/DNI(Do NOT Intubate)","Comfort Care","***"} Diet:  Diet Orders (From admission, onward)     Start     Ordered   01/23/23 0001  Diet NPO time specified  Diet effective midnight        01/22/23 2302           Family Communication:  ***  Consults:  ***  Admission status:   {Blank single:19197::"Observation","Inpatient"}, {Blank single:19197::"Med-Surg","Telemetry bed","Step Down Unit"}  Severity of Illness: {Observation/Inpatient:21159}   Dolly Rias, MD [facility]  How to contact the [facility] Attending or Consulting provider 7A - 7P or covering provider during after hours 7P -7A, for this patient.  Check the care team in [facility] and look for a) attending/consulting [facility] provider listed and b) the [facility] team listed Log into [email or web address] and use [facility]'s universal password to access. If you do not have the password, please contact the hospital operator. Locate the [facility] provider you are looking for under [facility] and page to a number that you can be directly reached. If you still have difficulty reaching the provider, please page the [facility] (Director on Call) for the Hospitalists listed on amion for assistance.  01/22/2023, 11:46 PM

## 2023-01-22 NOTE — H&P (Signed)
History and Physical    Hayden Moore GMW:[id number] DOB: [birth year] DOA: 01/22/2023  PCP: Georganna Skeans, MD   Patient coming from: Home   Chief Complaint:  Chief Complaint  Patient presents with   Hand Pain    HPI:  Hayden Moore is a 32 y.o. 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  Reports onset Thursday evening which woke him from sleep.  Severe pain mostly distally across all fingers, and in the medial palm.  Associated with this right hand was cold, worse distally.  Has hypoesthesia/paresthesia across all fingertips but worse in the fourth and fifth digit.  He has no history of prior venous or arterial thromboembolism.  No history of trauma to the arm.  Does report a history of blood clots in his younger brother who is on indefinite anticoagulation.  He has no further details about this.  His history is otherwise notable for longstanding inflammatory sounding arthritis.  Reports potentially being worked up in the past, although no records in system.  He has stiffness in the small joints of his hands which last for few hours in the morning, other joint pains worst in the small joints of his feet, elbows, knees, lower back.  Reports an episode of visual loss involving the left eye which she was seen in the emergency department here, appears possibly this was in 6/' 24.  Vision is now close to back to normal.  Denies skin manifestations, has eczema.  Does have a history of reflux.  Sometimes experiences anxiety but reports this is manifested with heart racing.  No known history of an arrhythmia. reports family history of inflammatory arthritis in his grandmother who has rheumatoid arthritis.  Notably he does not have insurance which is why he has not followed up about some of his more chronic medical problems.   Review of Systems:  ROS complete and negative except as  marked above   No Known Allergies  Prior to Admission medications   Medication Sig Start Date End Date Taking? Authorizing Provider  lisinopril (ZESTRIL) 10 MG tablet TAKE 1 TABLET BY MOUTH EVERY DAY 10/31/22  Yes Zonia Kief, Amy J, NP  meloxicam (MOBIC) 15 MG tablet Take 1 tablet (15 mg total) by mouth daily. 11/03/22  Yes Margaretann Loveless, PA-C    Past Medical History:  Diagnosis Date   Arthritis    GERD (gastroesophageal reflux disease)    Hypertension     History reviewed. No pertinent surgical history.   reports that he has been smoking cigarettes. He has never used smokeless tobacco. He reports current alcohol use. He reports that he does not use drugs.  Family History  Family history unknown: Yes     Physical Exam: Vitals:   01/22/23 2100 01/22/23 2130 01/22/23 2200 01/22/23 2230  BP: 135/78 130/81 (!) 147/79 (!) 141/93  Pulse: 62 63 82 68  Resp: (!) 27 (!) 23 (!) 25 (!) 22  Temp:      TempSrc:      SpO2: 100% 100% 100% 100%  Weight:      Height:        Gen: Awake, alert, NAD   CV: Regular, normal S1, S2, no murmurs, right radial pulses 1+, left radial pulses 2+ Resp: Normal WOB, CTAB  Abd: Flat, normoactive, nontender MSK: Possible slight skin thickening/tightening distally in the fingertips bilaterally.  No ulceration distally.  The left hand is swollen,  reports from recent boxers fracture.  Appears to be some synovitis in the small joints of the hand. Skin: There is small punctate area of petechiae in the volar aspect of the right fifth digit on the pad of the finger.  No other rashes or lesions to exposed skin Neuro: Alert and interactive, in the right upper extremity motor function is intact in the hand.  There is hypoesthesia over the medial right elbow, and in the hand especially in the fourth and fifth digit.  Psych: euthymic, appropriate    Data review:   Labs reviewed, notable for:   Blood counts are normal including differential INR is normal,  APTT is pending K3.5 AST 53, ALT 48 other LFTs normal  Micro:  Results for orders placed or performed during the hospital encounter of 10/12/22  Group A Strep by PCR     Status: None   Collection Time: 10/12/22  1:01 PM   Specimen: Throat; Sterile Swab  Result Value Ref Range Status   Group A Strep by PCR NOT DETECTED NOT DETECTED Final    Comment: Performed at [facility], [address] [place], [place] [address]    Imaging reviewed:  CT ANGIO UP EXTREM RIGHT W &/OR WO CONTRAST  Addendum Date: 01/22/2023   ADDENDUM REPORT: 01/22/2023 16:38 ADDENDUM: Findings reported by telephone to Dr. Wilkie Aye, 4:35 p.m. Electronically Signed   By: Jearld Lesch M.D.   On: 01/22/2023 16:38   Result Date: 01/22/2023 CLINICAL DATA:  Diminished pulses, claudication * Tracking Code: BO * EXAM: CT ANGIOGRAPHY UPPER RIGHT EXTREMITY TECHNIQUE: CT angiogram of the right upper extremity was obtained in the arterial phase of contrast administration. CONTRAST:  OMNIPAQUE IOHEXOL 350 MG/ML SOLN COMPARISON:  None Available. FINDINGS: Vascular: Normal contour and caliber of the right upper extremity vasculature. No significant atherosclerosis. 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 The vessel remains occluded through the division of the radial, ulnar, and interosseous arteries, with reconstitution of the branch vessels below the elbow (series 12, image 27). Branch vessels remain patent to the wrist. Vessels of the hand and palmar arch are not well appreciated, probably due to early phase of contrast. Osseous and musculoskeletal: No fracture or dislocation. Soft tissues unremarkable. Partially included chest: Right hilar and mediastinal lymphadenopathy (series 18,  image 78). Lower cervical and supraclavicular lymphadenopathy. Heterogeneous airspace disease throughout the partially imaged right lung. Review of the MIP images confirms the above findings. IMPRESSION: 1. 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 2. 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 The vessel remains occluded through the division of the radial, ulnar, and interosseous arteries, with reconstitution of the branch vessels below the elbow. 3. Branch vessels of the right forearm remain patent to the wrist. Vessels of the hand and palmar arch are not well appreciated, probably due to early phase of contrast. 4. Right hilar, mediastinal, and cervical lymphadenopathy, concerning for malignancy. 5. Heterogeneous airspace disease throughout the partially imaged right lung. Consider dedicated imaging of the chest. Call report request was placed at the time of interpretation. Report issued at this time in the interest of expedient communication. Final communication of critical  findings will be documented. Electronically Signed: By: Jearld Lesch M.D. On: 01/22/2023 16:31    EKG:  Pending  ED Course:  Imaging obtained including CTA findings above, vascular surgery was consulted.  Patient was started on a heparin drip with improvement in his right arm symptoms.  Tentative plan from vascular surgery for OR possible embolectomy tomorrow.   Assessment/Plan:  32 y.o. 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  Found to have possible dissection versus thrombus right subclavian artery, suspected arterial thromboembolism in the brachial artery  Arterial thromboembolism right upper extremity, possible dissection Sensory loss within C8, T1  distribution, possible ischemic nerve injury 3-day history of cold painful right hand and sensory loss distally.  CTA in the ED demonstrating focal nonocclusive thrombus or dissection flap in the right subclavian artery and occlusion of the distal right brachial artery with distal reconstitution felt to be secondary to a thrombus.  Vascular surgery has been consulted feel consistent with right upper extremity thrombus.  He has been started on heparin drip and symptoms are improving in the ED. his presentation is concerning for potential hypercoagulable state, please see further discussion below. -Vascular surgery consulted, tentative plan for OR possible embolectomy tomorrow -Continue heparin drip, pharmacy to dose -Neurovascular checks right upper extremity -Evaluation for possible cardioembolism with TTE, telemetry monitoring -Evaluation for underlying hypercoagulable state per below  Incidental finding thoracic and cervical lymphadenopathy CTA of the right upper extremity partially visualized right hilar, mediastinal, cervical lymphadenopathy.  Heterogenous airspace disease in the right lung.  Etiology is unclear, notably does have history concerning for a inflammatory arthritis.  Could consider a process like sarcoid given lung involvement and lymphadenopathy, arthropathy.  Malignancy not excluded.  I discussed this finding with the patient including need for further evaluation into etiology and the possibility in worst-case scenario for malignancy. -Obtain CT chest abdomen pelvis with IV contrast for further evaluation -COVID testing given airspace disease and thromboembolism.  Evaluation for hypercoagulability History of acute presentation with an arterial thromboembolism per above.  Family history of a clotting disorder with younger brother on indefinite anticoagulation, although patient does not know any further details.  History of suspected inflammatory arthropathy as described below -Check  antiphospholipid antibody panel  -Will need hematology follow-up outpatient  Suspect inflammatory arthropathy Reports longstanding history of arthritis symptoms, morning stiffness.  Worse in the small joints of the hands and the feet, also including knees elbows and lower back.  Possiblity of related eye disorder, episode of prior visual loss could potentially represent a uveitis although no diagnosis formally.  Thinks he may have had lab evaluation for this in the past, but I cannot locate record of this. Has been seen at [facility] and urgent care and Rx steroid with PCP f/u but no specialist evaluation. Prednisone reportedly greatly relieved his symptoms.  -Check ESR, CRP ANA, C3, C4, UA, UPCR, RF, CK -He will need follow-up with rheumatology outpatient -Symptomatic management: Hold his meloxicam preop, can use oxycodone low-dose for severe pain.  Can resume on NSAIDs postoperatively  Social determinants - Currently without insurance, needs social work involvement  Chronic medical problems: Hypertension: Hold lisinopril preop.  Can resume postoperatively  Body mass index is 34.44 kg/m.  Obesity class I affecting medical care per above  DVT prophylaxis:  IV heparin gtts Code Status:  Full Code Diet:  Diet Orders (From admission, onward)  Start     Ordered   01/23/23 0001  Diet NPO time specified  Diet effective midnight        01/22/23 2302           Family Communication: No Consults: Vascular surgery Admission status:   Inpatient, Step Down Unit  Severity of Illness: The appropriate patient status for this patient is INPATIENT. Inpatient status is judged to be reasonable and necessary in order to provide the required intensity of service to ensure the patient's safety. The patient's presenting symptoms, physical exam findings, and initial radiographic and laboratory data in the context of their chronic comorbidities is felt to place them at high risk for further clinical  deterioration. Furthermore, it is not anticipated that the patient will be medically stable for discharge from the hospital within 2 midnights of admission.   * I certify that at the point of admission it is my clinical judgment that the patient will require inpatient hospital care spanning beyond 2 midnights from the point of admission due to high intensity of service, high risk for further deterioration and high frequency of surveillance required.*   Dolly Rias, MD [facility]  How to contact the [facility] Attending or Consulting provider 7A - 7P or covering provider during after hours 7P -7A, for this patient.  Check the care team in [facility] and look for a) attending/consulting [facility] provider listed and b) the [facility] team listed Log into [email or web address] and use [facility]'s universal password to access. If you do not have the password, please contact the hospital operator. Locate the [facility] provider you are looking for under [facility] and page to a number that you can be directly reached. If you still have difficulty reaching the provider, please page the [facility] (Director on Call) for the Hospitalists listed on amion for assistance.  01/22/2023, 11:46 PM

## 2023-01-22 NOTE — ED Provider Notes (Signed)
Patient signed out to me by previous provider. Please refer to their note for full HPI.  Briefly this is a 32 year old male who presented with numbness and tingling to his right hand, spontaneously upon waking up.  He is right-hand dominant.  Report from previous provider is diminished right radial pulse and brachial pulse however palpable.  Patient signed out pending CT angiogram.  CT angiogram shows concern for axillary artery dissection versus clots with further arterial findings running down the right upper extremity.  Vascular surgery consulted, Dr. Sherral Hammers spoken with.  Currently he recommends heparin and potentially reimaging. Patient updated, heparin ordered, no pain at this moment but continued paresthesias.  Right radial pulse continues to be diminished compared to the left.  Dr. Sherral Hammers has evaluated the patient, heparin is running and he will be n.p.o. at midnight for surgery tomorrow morning.  Patient will be admitted to the hospitalist.  On reevaluation radial pulse is improving and paresthesias are improving.  Patients evaluation and results requires admission for further treatment and care.  Spoke with hospitalist, reviewed patient's ED course and they accept admission.  Patient agrees with admission plan, offers no new complaints and is stable/unchanged at time of admit.  .Critical Care  Performed by: Rozelle Logan, DO Authorized by: Rozelle Logan, DO   Critical care provider statement:    Critical care time (minutes):  30   Critical care time was exclusive of:  Separately billable procedures and treating other patients   Critical care was necessary to treat or prevent imminent or life-threatening deterioration of the following conditions:  Circulatory failure   Critical care was time spent personally by me on the following activities:  Development of treatment plan with patient or surrogate, discussions with consultants, evaluation of patient's response to treatment,  examination of patient, ordering and review of laboratory studies, ordering and review of radiographic studies, ordering and performing treatments and interventions, pulse oximetry, re-evaluation of patient's condition and review of old charts   Care discussed with: admitting provider       Rozelle Logan, DO 01/22/23 2235

## 2023-01-22 NOTE — ED Notes (Signed)
Pt reports a "shooting sharpness" to right side of neck.

## 2023-01-22 NOTE — Progress Notes (Signed)
ANTICOAGULATION CONSULT NOTE - Initial Consult  Pharmacy Consult for heparin Indication: arterial thrombus  No Known Allergies  Patient Measurements: Weight: 112 kg (247 lb) Heparin Dosing Weight: 99 kg  Vital Signs: Temp: 98.5 F (36.9 C) (09/29 1813) BP: 137/73 (09/29 1813) Pulse Rate: 63 (09/29 1813)  Labs: Recent Labs    01/22/23 1323  HGB 13.6  HCT 40.8  PLT 287  LABPROT 14.4  INR 1.1  CREATININE 1.16    Estimated Creatinine Clearance: 116.4 mL/min (by C-G formula based on SCr of 1.16 mg/dL).  Medical History: Past Medical History:  Diagnosis Date   Arthritis    GERD (gastroesophageal reflux disease)    Hypertension     Assessment: 32 yo male presents with right upper extremity numbness and tingling found to have "nonocclusive thrombus or focal dissection flap within the proximal right subclavian artery" on CTA of RUE.  Not on anticoagulation prior to admission.  Pharmacy consulted for heparin dosing.  CBC stable - Hgb 13.6, Plt 287.   Goal of Therapy:  Heparin level 0.3-0.7 units/ml Monitor platelets by anticoagulation protocol: Yes   Plan:  Give heparin bolus 5500 units x1 Start heparin infusion 1600 units/hr 6 hour heparin level Daily CBC, heparin level Monitor for s/sx of bleeding  Trixie Rude, PharmD Clinical Pharmacist 01/22/2023  6:44 PM

## 2023-01-22 NOTE — ED Provider Notes (Signed)
[employer] EMERGENCY DEPARTMENT AT [facility] Provider Note   CSN: [id number] Arrival date & time: 01/22/23  1156     History  Chief Complaint  Patient presents with   Hand Pain    Hayden Moore is a 32 y.o. male.  This is a 32 year old male who presents emergency department today due to numbness and tingling in his right hand of 2 days duration.  Patient is right-hand dominant.  He denies any trauma to the area.  No swelling of the hand.   Hand Pain       Home Medications Prior to Admission medications   Medication Sig Start Date End Date Taking? Authorizing Provider  albuterol (VENTOLIN HFA) 108 (90 Base) MCG/ACT inhaler Inhale 2 puffs into the lungs every 4 (four) hours as needed for wheezing or shortness of breath. 12/01/20   Bethann Berkshire, MD  cyclobenzaprine (FLEXERIL) 10 MG tablet Take 1 tablet (10 mg total) by mouth 2 (two) times daily as needed for muscle spasms. 07/13/22   Piontek, Denny Peon, MD  lisinopril (ZESTRIL) 10 MG tablet TAKE 1 TABLET BY MOUTH EVERY DAY 10/31/22   Rema Fendt, NP  meloxicam (MOBIC) 15 MG tablet Take 1 tablet (15 mg total) by mouth daily. 11/03/22   Margaretann Loveless, PA-C      Allergies    Patient has no known allergies.    Review of Systems   Review of Systems  Physical Exam Updated Vital Signs BP 123/74   Pulse 90   Temp 99.2 F (37.3 C)   Resp 20   SpO2 96%  Physical Exam Vitals reviewed.  Cardiovascular:     Rate and Rhythm: Normal rate.     Heart sounds: No murmur heard.    Comments: Diminished right radial pulses.  Diminished right brachial pulses. Pulmonary:     Effort: Pulmonary effort is normal.  Musculoskeletal:        General: No swelling, tenderness or deformity.  Neurological:     Mental Status: He is alert.     Sensory: No sensory deficit.     Motor: No weakness.     ED Results / Procedures / Treatments   Labs (all labs ordered are listed, but only abnormal results are displayed) Labs  Reviewed  COMPREHENSIVE METABOLIC PANEL - Abnormal; Notable for the following components:      Result Value   Albumin 3.1 (*)    AST 53 (*)    ALT 48 (*)    All other components within normal limits  CBC WITH DIFFERENTIAL/PLATELET  PROTIME-INR    EKG None  Radiology No results found.  Procedures Procedures    Medications Ordered in ED Medications - No data to display  ED Course/ Medical Decision Making/ A&P                                 Medical Decision Making 32 year old male here today for numbness, tingling, cold sensation in his right upper extremity.  Differential diagnoses include arterial occlusion, considered DVT, Raynaud's, less likely neurological process.  Plan-I am concerned about the patient's diminished pulses in his right upper extremity.  Will obtain CT angiography.  Patient has intact and sensation in the right upper extremity.  His fingertips are still warm.  He has capillary refill.  He has no neck pain.  Normal carotid pulse.  No bruit.  If imaging is negative, would consider potential for Raynaud's, thoracic outlet  syndrome.  Reassessment-2:48 PM.  Patient be signed out to Dr. Wilkie Aye pending CTA of arm.  Amount and/or Complexity of Data Reviewed Labs: ordered. Radiology: ordered.           Final Clinical Impression(s) / ED Diagnoses Final diagnoses:  Decreased radial pulse    Rx / DC Orders ED Discharge Orders     None         Anders Simmonds T, DO 01/22/23 1449

## 2023-01-22 NOTE — ED Triage Notes (Signed)
Patient reports two days of pain and cold sensation to R hand and forearm. Denies injury.

## 2023-01-22 NOTE — Consult Note (Signed)
Hospital Consult    Reason for Consult: Right upper extremity decreased pulses Requesting Physician: ED MRN #:  [id number]  History of Present Illness: This is a 32 y.o. male who presented with a 3-day history of paresthesias and pain in the right upper extremity which is waxed and waned.  She referred first appreciated this pain on Thursday night going into Friday.  He noted numbness in the fifth finger with pain in the hand.  This improved, but then worsened last night.  He presented to the ED today as he continued to have numbness with accompanying pain.  On exam, Hayden Moore was resting comfortably.  Heparin had been started, and he stated his hand felt much better with the heparin.  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  His other health issues have limited his ability to work.  Originally from [place], he has lived in [place] for over 15 years.  Hayden Moore denies prior history of arm surgery, recent trauma. His brother has a hypercoagulable disorder, and is anticoagulated.  He has had multiple episodes of feeling like his heart skips a beat or beats abnormally. He has high blood pressure but is compliant with his medications.  Past Medical History:  Diagnosis Date   Arthritis    GERD (gastroesophageal reflux disease)    Hypertension     History reviewed. No pertinent surgical history.  No Known Allergies  Prior to Admission medications   Medication Sig Start Date End Date Taking? Authorizing Provider  albuterol (VENTOLIN HFA) 108 (90 Base) MCG/ACT inhaler Inhale 2 puffs into the lungs every 4 (four) hours as needed for wheezing or shortness of breath. 12/01/20   Bethann Berkshire, MD  cyclobenzaprine (FLEXERIL) 10 MG tablet Take 1 tablet (10 mg total) by mouth 2 (two) times daily as needed for muscle spasms. 07/13/22   Piontek, Denny Peon, MD  lisinopril (ZESTRIL) 10 MG tablet TAKE 1 TABLET BY MOUTH EVERY DAY 10/31/22   Rema Fendt, NP  meloxicam (MOBIC) 15 MG tablet Take 1  tablet (15 mg total) by mouth daily. 11/03/22   Margaretann Loveless, PA-C    Social History   Socioeconomic History   Marital status: Single    Spouse name: Not on file   Number of children: Not on file   Years of education: Not on file   Highest education level: Not on file  Occupational History   Not on file  Tobacco Use   Smoking status: Every Day    Current packs/day: 0.50    Types: Cigarettes   Smokeless tobacco: Never  Vaping Use   Vaping status: Never Used  Substance and Sexual Activity   Alcohol use: Yes   Drug use: No   Sexual activity: Not on file  Other Topics Concern   Not on file  Social History Narrative   Not on file   Social Determinants of Health   Financial Resource Strain: Not on file  Food Insecurity: Not on file  Transportation Needs: Not on file  Physical Activity: Not on file  Stress: Not on file  Social Connections: Not on file  Intimate Partner Violence: Not on file   Family History  Family history unknown: Yes    ROS: Otherwise negative unless mentioned in HPI  Physical Examination  Vitals:   01/22/23 2030 01/22/23 2100  BP: (!) 147/94 135/78  Pulse: 90 62  Resp: 17 (!) 27  Temp:    SpO2: 100% 100%   Body mass  index is 34.44 kg/m.  General:  WDWN in NAD Gait: Not observed HENT: WNL, normocephalic Pulmonary: normal non-labored breathing, without Rales, rhonchi,  wheezing Cardiac: regular Abdomen:  soft, NT/ND, no masses Skin: without rashes Vascular Exam/Pulses: Monophasic radial, monophasic palmar arch Extremities: without ischemic changes, without Gangrene , without cellulitis; without open wounds;  Some paresthesias in the fifth digit, otherwise stated was relatively normal.  Warm on exam Musculoskeletal: no muscle wasting or atrophy  Neurologic: A&O X 3;  No focal weakness or paresthesias are detected; speech is fluent/normal Psychiatric:  The pt has Normal affect. Lymph:  Unremarkable  CBC    Component Value  Date/Time   WBC 5.6 01/22/2023 1323   RBC 4.51 01/22/2023 1323   HGB 13.6 01/22/2023 1323   HCT 40.8 01/22/2023 1323   PLT 287 01/22/2023 1323   MCV 90.5 01/22/2023 1323   MCH 30.2 01/22/2023 1323   MCHC 33.3 01/22/2023 1323   RDW 14.0 01/22/2023 1323   LYMPHSABS 1.2 01/22/2023 1323   MONOABS 0.8 01/22/2023 1323   EOSABS 0.2 01/22/2023 1323   BASOSABS 0.0 01/22/2023 1323    BMET    Component Value Date/Time   NA 138 01/22/2023 1323   NA 139 08/10/2022 1029   K 3.5 01/22/2023 1323   CL 103 01/22/2023 1323   CO2 24 01/22/2023 1323   GLUCOSE 98 01/22/2023 1323   BUN 10 01/22/2023 1323   BUN 5 (L) 08/10/2022 1029   CREATININE 1.16 01/22/2023 1323   CALCIUM 8.9 01/22/2023 1323   GFRNONAA >60 01/22/2023 1323   GFRAA >60 05/04/2019 1452    COAGS: Lab Results  Component Value Date   INR 1.1 01/22/2023      ASSESSMENT/PLAN: This is a 32 y.o. male presenting with a 3-day history of right upper extremity numbness and pain extending into the digits.  Since arrival, heparin has helped him dramatically.  He only notes some paresthesias in the fifth digit. On physical exam, has had a nice and warm.  He has monophasic signals extending into the palmar arch. Imaging was reviewed demonstrating thrombus within the axillary artery as well as at the brachial bifurcation.  Being that this occurred days ago, and symptoms have improved with heparin, I do not think this requires immediate OR for brachial artery embolectomy.  I do think he would benefit from embolectomy as flow is still abnormal, and I plan to do this tomorrow in the operating room.  Please make n.p.o. midnight I appreciate hospital medicine's involvement Will see in the AM.   Victorino Sparrow MD MS [facility] [phone] 01/22/2023  9:52 PM

## 2023-01-23 ENCOUNTER — Encounter (HOSPITAL_COMMUNITY): Payer: Self-pay | Admitting: Internal Medicine

## 2023-01-23 ENCOUNTER — Inpatient Hospital Stay (HOSPITAL_COMMUNITY): Payer: Self-pay | Admitting: Anesthesiology

## 2023-01-23 ENCOUNTER — Inpatient Hospital Stay (HOSPITAL_COMMUNITY): Payer: Self-pay

## 2023-01-23 ENCOUNTER — Other Ambulatory Visit: Payer: Self-pay

## 2023-01-23 ENCOUNTER — Encounter (HOSPITAL_COMMUNITY)
Admission: EM | Disposition: A | Discharge status: Home / Self Care | Payer: Self-pay | Source: Home / Self Care | Attending: Internal Medicine

## 2023-01-23 ENCOUNTER — Other Ambulatory Visit (HOSPITAL_COMMUNITY): Payer: Self-pay

## 2023-01-23 DIAGNOSIS — I742 Embolism and thrombosis of arteries of the upper extremities: Secondary | ICD-10-CM

## 2023-01-23 DIAGNOSIS — F17210 Nicotine dependence, cigarettes, uncomplicated: Secondary | ICD-10-CM

## 2023-01-23 HISTORY — PX: EMBOLECTOMY: SHX44

## 2023-01-23 LAB — HIV ANTIBODY (ROUTINE TESTING W REFLEX): HIV Screen 4th Generation wRfx: NONREACTIVE

## 2023-01-23 LAB — SEDIMENTATION RATE: Sed Rate: 5 mm/h (ref 0–16)

## 2023-01-23 LAB — CBC
HCT: 38.6 % — ABNORMAL LOW (ref 39.0–52.0)
Hemoglobin: 12.6 g/dL — ABNORMAL LOW (ref 13.0–17.0)
MCH: 29.4 pg (ref 26.0–34.0)
MCHC: 32.6 g/dL (ref 30.0–36.0)
MCV: 90.2 fL (ref 80.0–100.0)
Platelets: 260 10*3/uL (ref 150–400)
RBC: 4.28 MIL/uL (ref 4.22–5.81)
RDW: 14.1 % (ref 11.5–15.5)
WBC: 6.7 10*3/uL (ref 4.0–10.5)
nRBC: 0 % (ref 0.0–0.2)

## 2023-01-23 LAB — BASIC METABOLIC PANEL
Anion gap: 8 (ref 5–15)
BUN: 8 mg/dL (ref 6–20)
CO2: 26 mmol/L (ref 22–32)
Calcium: 8.5 mg/dL — ABNORMAL LOW (ref 8.9–10.3)
Chloride: 101 mmol/L (ref 98–111)
Creatinine, Ser: 1.17 mg/dL (ref 0.61–1.24)
GFR, Estimated: >60 mL/min (ref >60)
Glucose, Bld: 118 mg/dL — ABNORMAL HIGH (ref 70–99)
Potassium: 3.2 mmol/L — ABNORMAL LOW (ref 3.5–5.1)
Sodium: 135 mmol/L (ref 135–145)

## 2023-01-23 LAB — TYPE AND SCREEN
ABO/RH(D): O NEG
Antibody Screen: NEGATIVE

## 2023-01-23 LAB — SARS CORONAVIRUS 2 BY RT PCR: SARS Coronavirus 2 by RT PCR: NEGATIVE

## 2023-01-23 LAB — SURGICAL PCR SCREEN
MRSA, PCR: NEGATIVE
Staphylococcus aureus: NEGATIVE

## 2023-01-23 LAB — URINALYSIS, ROUTINE W REFLEX MICROSCOPIC
Bilirubin Urine: NEGATIVE
Glucose, UA: NEGATIVE mg/dL
Hgb urine dipstick: NEGATIVE
Ketones, ur: NEGATIVE mg/dL
Leukocytes,Ua: NEGATIVE
Nitrite: NEGATIVE
Protein, ur: NEGATIVE mg/dL
Specific Gravity, Urine: >1.046 — ABNORMAL HIGH (ref 1.005–1.030)
pH: 6 (ref 5.0–8.0)

## 2023-01-23 LAB — PROTEIN / CREATININE RATIO, URINE
Creatinine, Urine: 240 mg/dL
Protein Creatinine Ratio: 0.11 mg/mg{creat} (ref 0.00–0.15)
Total Protein, Urine: 26 mg/dL

## 2023-01-23 LAB — MAGNESIUM: Magnesium: 2.1 mg/dL (ref 1.7–2.4)

## 2023-01-23 LAB — C-REACTIVE PROTEIN: CRP: 1.3 mg/dL — ABNORMAL HIGH (ref <1.0)

## 2023-01-23 LAB — HEPARIN LEVEL (UNFRACTIONATED)
Heparin Unfractionated: 0.23 [IU]/mL — ABNORMAL LOW (ref 0.30–0.70)
Heparin Unfractionated: 0.41 [IU]/mL (ref 0.30–0.70)

## 2023-01-23 LAB — ABO/RH: ABO/RH(D): O NEG

## 2023-01-23 LAB — LACTATE DEHYDROGENASE: LDH: 181 U/L (ref 98–192)

## 2023-01-23 LAB — PHOSPHORUS: Phosphorus: 3 mg/dL (ref 2.5–4.6)

## 2023-01-23 SURGERY — EMBOLECTOMY
Anesthesia: General | Laterality: Right

## 2023-01-23 MED ORDER — OXYCODONE HCL 5 MG PO TABS: 5.0000 mg | ORAL_TABLET | Freq: Once | ORAL | Status: DC | PRN

## 2023-01-23 MED ORDER — IOHEXOL 350 MG/ML SOLN
75.0000 mL | Freq: Once | INTRAVENOUS | Status: AC | PRN
  Administered 2023-01-23: 75 mL via INTRAVENOUS

## 2023-01-23 MED ORDER — CHLORHEXIDINE GLUCONATE 0.12 % MT SOLN
15.0000 mL | Freq: Once | OROMUCOSAL | Status: AC
  Administered 2023-01-23: 15 mL via OROMUCOSAL

## 2023-01-23 MED ORDER — POTASSIUM CHLORIDE CRYS ER 20 MEQ PO TBCR
40.0000 meq | EXTENDED_RELEASE_TABLET | Freq: Once | ORAL | Status: AC
  Administered 2023-01-23: 40 meq via ORAL
  Filled 2023-01-23: qty 2

## 2023-01-23 MED ORDER — FENTANYL CITRATE (PF) 100 MCG/2ML IJ SOLN
INTRAMUSCULAR | Status: AC
  Filled 2023-01-23: qty 2

## 2023-01-23 MED ORDER — LIDOCAINE 2% (20 MG/ML) 5 ML SYRINGE
INTRAMUSCULAR | Status: DC | PRN
  Administered 2023-01-23: 100 mg via INTRAVENOUS

## 2023-01-23 MED ORDER — ORAL CARE MOUTH RINSE: 15.0000 mL | OROMUCOSAL | Status: DC | PRN

## 2023-01-23 MED ORDER — HEPARIN 6000 UNIT IRRIGATION SOLUTION
Status: DC | PRN
  Administered 2023-01-23: 1

## 2023-01-23 MED ORDER — OXYCODONE HCL 5 MG/5ML PO SOLN: 5.0000 mg | Freq: Once | ORAL | Status: DC | PRN

## 2023-01-23 MED ORDER — DEXMEDETOMIDINE HCL IN NACL 80 MCG/20ML IV SOLN
INTRAVENOUS | Status: AC
  Filled 2023-01-23: qty 20

## 2023-01-23 MED ORDER — ACETAMINOPHEN 10 MG/ML IV SOLN: 1000.0000 mg | Freq: Once | INTRAVENOUS | Status: DC | PRN

## 2023-01-23 MED ORDER — 0.9 % SODIUM CHLORIDE (POUR BTL) OPTIME
TOPICAL | Status: DC | PRN
  Administered 2023-01-23: 1000 mL

## 2023-01-23 MED ORDER — HEPARIN SODIUM (PORCINE) 1000 UNIT/ML IJ SOLN
INTRAMUSCULAR | Status: DC | PRN
Start: 2023-01-23 — End: 2023-01-23
  Administered 2023-01-23: 8000 [IU] via INTRAVENOUS

## 2023-01-23 MED ORDER — LIDOCAINE 2% (20 MG/ML) 5 ML SYRINGE
INTRAMUSCULAR | Status: AC
  Filled 2023-01-23: qty 5

## 2023-01-23 MED ORDER — ROCURONIUM BROMIDE 10 MG/ML (PF) SYRINGE
PREFILLED_SYRINGE | INTRAVENOUS | Status: DC | PRN
  Administered 2023-01-23: 100 mg via INTRAVENOUS

## 2023-01-23 MED ORDER — FENTANYL CITRATE (PF) 250 MCG/5ML IJ SOLN
INTRAMUSCULAR | Status: DC | PRN
  Administered 2023-01-23 (×2): 100 ug via INTRAVENOUS
  Administered 2023-01-23: 50 ug via INTRAVENOUS

## 2023-01-23 MED ORDER — ONDANSETRON HCL 4 MG/2ML IJ SOLN
INTRAMUSCULAR | Status: DC | PRN
  Administered 2023-01-23: 4 mg via INTRAVENOUS

## 2023-01-23 MED ORDER — MUPIROCIN 2 % EX OINT: 1.0000 | TOPICAL_OINTMENT | Freq: Two times a day (BID) | CUTANEOUS | Status: DC

## 2023-01-23 MED ORDER — ONDANSETRON HCL 4 MG/2ML IJ SOLN: 4.0000 mg | Freq: Once | INTRAMUSCULAR | Status: DC | PRN

## 2023-01-23 MED ORDER — DEXMEDETOMIDINE HCL IN NACL 80 MCG/20ML IV SOLN
INTRAVENOUS | Status: DC | PRN
  Administered 2023-01-23: 20 ug via INTRAVENOUS

## 2023-01-23 MED ORDER — LACTATED RINGERS IV SOLN: INTRAVENOUS | Status: DC

## 2023-01-23 MED ORDER — OXYCODONE HCL 5 MG PO TABS: 2.5000 mg | ORAL_TABLET | ORAL | Status: DC | PRN

## 2023-01-23 MED ORDER — ROCURONIUM BROMIDE 10 MG/ML (PF) SYRINGE
PREFILLED_SYRINGE | INTRAVENOUS | Status: AC
  Filled 2023-01-23: qty 10

## 2023-01-23 MED ORDER — SUGAMMADEX SODIUM 200 MG/2ML IV SOLN
INTRAVENOUS | Status: DC | PRN
  Administered 2023-01-23: 400 mg via INTRAVENOUS

## 2023-01-23 MED ORDER — CEFAZOLIN SODIUM-DEXTROSE 2-4 GM/100ML-% IV SOLN
INTRAVENOUS | Status: AC
  Filled 2023-01-23: qty 100

## 2023-01-23 MED ORDER — ORAL CARE MOUTH RINSE: 15.0000 mL | Freq: Once | OROMUCOSAL | Status: AC

## 2023-01-23 MED ORDER — DEXAMETHASONE SODIUM PHOSPHATE 10 MG/ML IJ SOLN
INTRAMUSCULAR | Status: DC | PRN
  Administered 2023-01-23: 8 mg via INTRAVENOUS

## 2023-01-23 MED ORDER — FENTANYL CITRATE (PF) 250 MCG/5ML IJ SOLN
INTRAMUSCULAR | Status: AC
  Filled 2023-01-23: qty 5

## 2023-01-23 MED ORDER — OXYCODONE HCL 5 MG PO TABS
5.0000 mg | ORAL_TABLET | ORAL | Status: DC | PRN
  Administered 2023-01-23 – 2023-01-25 (×9): 5 mg via ORAL
  Filled 2023-01-23 (×9): qty 1

## 2023-01-23 MED ORDER — MIDAZOLAM HCL 2 MG/2ML IJ SOLN
INTRAMUSCULAR | Status: AC
  Filled 2023-01-23: qty 2

## 2023-01-23 MED ORDER — PROPOFOL 10 MG/ML IV BOLUS
INTRAVENOUS | Status: AC
  Filled 2023-01-23: qty 20

## 2023-01-23 MED ORDER — ONDANSETRON HCL 4 MG/2ML IJ SOLN
INTRAMUSCULAR | Status: AC
  Filled 2023-01-23: qty 2

## 2023-01-23 MED ORDER — HEPARIN BOLUS VIA INFUSION
3000.0000 [IU] | Freq: Once | INTRAVENOUS | Status: AC
  Administered 2023-01-23: 3000 [IU] via INTRAVENOUS
  Filled 2023-01-23: qty 3000

## 2023-01-23 MED ORDER — CEFAZOLIN SODIUM-DEXTROSE 2-4 GM/100ML-% IV SOLN
2.0000 g | Freq: Once | INTRAVENOUS | Status: AC
  Administered 2023-01-23: 2 g via INTRAVENOUS

## 2023-01-23 MED ORDER — FENTANYL CITRATE (PF) 100 MCG/2ML IJ SOLN
25.0000 ug | INTRAMUSCULAR | Status: DC | PRN
  Administered 2023-01-23 (×2): 50 ug via INTRAVENOUS

## 2023-01-23 MED ORDER — DEXAMETHASONE SODIUM PHOSPHATE 10 MG/ML IJ SOLN
INTRAMUSCULAR | Status: AC
  Filled 2023-01-23: qty 1

## 2023-01-23 MED ORDER — MIDAZOLAM HCL 2 MG/2ML IJ SOLN
INTRAMUSCULAR | Status: DC | PRN
  Administered 2023-01-23: 2 mg via INTRAVENOUS

## 2023-01-23 MED ORDER — HEPARIN SODIUM (PORCINE) 1000 UNIT/ML IJ SOLN
INTRAMUSCULAR | Status: AC
  Filled 2023-01-23: qty 10

## 2023-01-23 MED ORDER — PROPOFOL 10 MG/ML IV BOLUS
INTRAVENOUS | Status: DC | PRN
  Administered 2023-01-23: 200 mg via INTRAVENOUS

## 2023-01-23 MED ORDER — MORPHINE SULFATE (PF) 2 MG/ML IV SOLN
2.0000 mg | INTRAVENOUS | Status: DC | PRN
  Administered 2023-01-23 – 2023-01-25 (×2): 2 mg via INTRAVENOUS
  Filled 2023-01-23 (×2): qty 1

## 2023-01-23 MED ORDER — HEPARIN (PORCINE) 25000 UT/250ML-% IV SOLN
1900.0000 [IU]/h | INTRAVENOUS | Status: DC
  Administered 2023-01-24: 1900 [IU]/h via INTRAVENOUS
  Filled 2023-01-23: qty 250

## 2023-01-23 SURGICAL SUPPLY — 63 items
ADH SKN CLS APL DERMABOND .7 (GAUZE/BANDAGES/DRESSINGS) ×1
BAG COUNTER SPONGE SURGICOUNT (BAG) ×2 IMPLANT
BAG SPNG CNTER NS LX DISP (BAG) ×1
BANDAGE ESMARK 6X9 LF (GAUZE/BANDAGES/DRESSINGS) IMPLANT
BNDG CMPR 5X4 KNIT ELC UNQ LF (GAUZE/BANDAGES/DRESSINGS) ×1
BNDG CMPR 6 X 5 YARDS HK CLSR (GAUZE/BANDAGES/DRESSINGS) ×1
BNDG CMPR 9X6 STRL LF SNTH (GAUZE/BANDAGES/DRESSINGS)
BNDG ELASTIC 4INX 5YD STR LF (GAUZE/BANDAGES/DRESSINGS) IMPLANT
BNDG ELASTIC 6INX 5YD STR LF (GAUZE/BANDAGES/DRESSINGS) IMPLANT
BNDG ESMARK 6X9 LF (GAUZE/BANDAGES/DRESSINGS)
CANISTER SUCT 3000ML PPV (MISCELLANEOUS) ×2 IMPLANT
CATH EMB 3FR 80 (CATHETERS) IMPLANT
CATH EMB 4FR 80 (CATHETERS) IMPLANT
CATH EMB 5FR 80CM (CATHETERS) IMPLANT
CLIP TI MEDIUM 24 (CLIP) ×2 IMPLANT
CLIP TI MEDIUM 6 (CLIP) IMPLANT
CLIP TI WIDE RED SMALL 24 (CLIP) ×2 IMPLANT
CLIP TI WIDE RED SMALL 6 (CLIP) IMPLANT
CNTNR URN SCR LID CUP LEK RST (MISCELLANEOUS) IMPLANT
CONT SPEC 4OZ STRL OR WHT (MISCELLANEOUS) ×1
COVER ULTRASOUND PROBE 36 ST (MISCELLANEOUS) IMPLANT
CUFF TOURN SGL QUICK 18X4 (TOURNIQUET CUFF) IMPLANT
CUFF TOURN SGL QUICK 24 (TOURNIQUET CUFF)
CUFF TOURN SGL QUICK 34 (TOURNIQUET CUFF)
CUFF TOURN SGL QUICK 42 (TOURNIQUET CUFF) IMPLANT
CUFF TRNQT CYL 24X4X16.5-23 (TOURNIQUET CUFF) IMPLANT
CUFF TRNQT CYL 34X4.125X (TOURNIQUET CUFF) IMPLANT
DERMABOND ADVANCED .7 DNX12 (GAUZE/BANDAGES/DRESSINGS) IMPLANT
DRAIN CHANNEL 15F RND FF W/TCR (WOUND CARE) IMPLANT
DRAPE X-RAY CASS 24X20 (DRAPES) IMPLANT
ELECT REM PT RETURN 9FT ADLT (ELECTROSURGICAL) ×1
ELECTRODE REM PT RTRN 9FT ADLT (ELECTROSURGICAL) ×2 IMPLANT
EVACUATOR SILICONE 100CC (DRAIN) IMPLANT
GLOVE BIO SURGEON STRL SZ 6.5 (GLOVE) IMPLANT
GLOVE BIOGEL PI IND STRL 7.0 (GLOVE) IMPLANT
GLOVE BIOGEL PI IND STRL 8 (GLOVE) ×2 IMPLANT
GLOVE ECLIPSE 7.5 STRL STRAW (GLOVE) IMPLANT
GOWN STRL REUS W/ TWL LRG LVL3 (GOWN DISPOSABLE) ×4 IMPLANT
GOWN STRL REUS W/TWL 2XL LVL3 (GOWN DISPOSABLE) ×4 IMPLANT
GOWN STRL REUS W/TWL LRG LVL3 (GOWN DISPOSABLE) ×2
KIT BASIN OR (CUSTOM PROCEDURE TRAY) ×2 IMPLANT
KIT TURNOVER KIT B (KITS) ×2 IMPLANT
LOOP VASCULAR MINI 18 RED (MISCELLANEOUS) ×1
NS IRRIG 1000ML POUR BTL (IV SOLUTION) ×4 IMPLANT
PACK PERIPHERAL VASCULAR (CUSTOM PROCEDURE TRAY) ×2 IMPLANT
PAD ARMBOARD 7.5X6 YLW CONV (MISCELLANEOUS) ×4 IMPLANT
SET COLLECT BLD 21X3/4 12 (NEEDLE) IMPLANT
STOPCOCK 4 WAY LG BORE MALE ST (IV SETS) IMPLANT
SUT MNCRL AB 4-0 PS2 18 (SUTURE) ×2 IMPLANT
SUT PROLENE 5 0 C 1 24 (SUTURE) ×2 IMPLANT
SUT PROLENE 6 0 BV (SUTURE) ×2 IMPLANT
SUT SILK 2 0 PERMA HAND 18 BK (SUTURE) IMPLANT
SUT VIC AB 2-0 CT1 27 (SUTURE) ×1
SUT VIC AB 2-0 CT1 TAPERPNT 27 (SUTURE) ×2 IMPLANT
SUT VIC AB 3-0 SH 27 (SUTURE) ×1
SUT VIC AB 3-0 SH 27X BRD (SUTURE) ×2 IMPLANT
SYR 3ML LL SCALE MARK (SYRINGE) ×2 IMPLANT
TOWEL GREEN STERILE (TOWEL DISPOSABLE) ×2 IMPLANT
TRAY FOLEY MTR SLVR 16FR STAT (SET/KITS/TRAYS/PACK) ×2 IMPLANT
TUBING EXTENTION W/L.L. (IV SETS) IMPLANT
UNDERPAD 30X36 HEAVY ABSORB (UNDERPADS AND DIAPERS) ×2 IMPLANT
VASCULAR TIE MINI RED 18IN STL (MISCELLANEOUS) IMPLANT
WATER STERILE IRR 1000ML POUR (IV SOLUTION) ×2 IMPLANT

## 2023-01-23 NOTE — Transfer of Care (Signed)
Immediate Anesthesia Transfer of Care Note  Patient: Hayden Moore  Procedure(s) Performed: ARM BRACHIAL EMBOLECTOMY (Right)  Patient Location: PACU  Anesthesia Type:General  Level of Consciousness: awake and alert   Airway & Oxygen Therapy: Patient Spontanous Breathing and Patient connected to face mask oxygen  Post-op Assessment: Report given to RN and Post -op Vital signs reviewed and stable  Post vital signs: Reviewed and stable  Last Vitals:  Vitals Value Taken Time  BP 179/92 01/23/23 1642  Temp    Pulse 97 01/23/23 1643  Resp    SpO2 96 % 01/23/23 1643  Vitals shown include unfiled device data.  Last Pain:  Vitals:   01/23/23 1150  TempSrc:   PainSc: 3       Patients Stated Pain Goal: 3 (01/23/23 1150)  Complications: No notable events documented.

## 2023-01-23 NOTE — Progress Notes (Addendum)
  Progress Note    01/23/2023 6:45 AM Hospital Day 1  Subjective:  his hand feels about the same-no change  afebrile  Vitals:   01/23/23 0430 01/23/23 0513  BP: 127/80 (!) 150/80  Pulse: (!) 41 (!) 52  Resp: (!) 23 20  Temp: 97.9 F (36.6 C) 98 F (36.7 C)  SpO2: 100% 97%    Physical Exam: General:  no distress Lungs:  non labored Extremities:  right hand warm with strong hand grip.  Unable to palpate radial pulse.   CBC    Component Value Date/Time   WBC 6.7 01/23/2023 0148   RBC 4.28 01/23/2023 0148   HGB 12.6 (L) 01/23/2023 0148   HCT 38.6 (L) 01/23/2023 0148   PLT 260 01/23/2023 0148   MCV 90.2 01/23/2023 0148   MCH 29.4 01/23/2023 0148   MCHC 32.6 01/23/2023 0148   RDW 14.1 01/23/2023 0148   LYMPHSABS 1.2 01/22/2023 1323   MONOABS 0.8 01/22/2023 1323   EOSABS 0.2 01/22/2023 1323   BASOSABS 0.0 01/22/2023 1323    BMET    Component Value Date/Time   NA 135 01/23/2023 0148   NA 139 08/10/2022 1029   K 3.2 (L) 01/23/2023 0148   CL 101 01/23/2023 0148   CO2 26 01/23/2023 0148   GLUCOSE 118 (H) 01/23/2023 0148   BUN 8 01/23/2023 0148   BUN 5 (L) 08/10/2022 1029   CREATININE 1.17 01/23/2023 0148   CALCIUM 8.5 (L) 01/23/2023 0148   GFRNONAA >60 01/23/2023 0148   GFRAA >60 05/04/2019 1452    INR    Component Value Date/Time   INR 1.1 01/22/2023 1323     Intake/Output Summary (Last 24 hours) at 01/23/2023 0645 Last data filed at 01/23/2023 0500 Gross per 24 hour  Intake 0 ml  Output 450 ml  Net -450 ml     Assessment/Plan:  32 y.o. male with ischemic RUE  Hospital Day 1  -plan for OR later today for RUE embolectomy -continue heparin gtt -continue npo   Doreatha Massed, PA-C [facility] [phone] 01/23/2023 6:45 AM  VASCULAR STAFF ADDENDUM: I have independently interviewed and examined the patient. I agree with the above.  No change in physical exam, monophasic radial, monophasic palmar arch.  Hand is  warm. After discussing risk and benefits of right arm brachial and axillary embolectomy today, Scout elected to proceed.  Please continue heparin.  Will continue in OR.  Victorino Sparrow MD [facility] Phone Number: [phone] 01/23/2023 9:00 AM

## 2023-01-23 NOTE — Anesthesia Preprocedure Evaluation (Signed)
Anesthesia Evaluation  Patient identified by MRN, date of birth, ID band Patient awake    Reviewed: Allergy & Precautions, NPO status , Patient's Chart, lab work & pertinent test results, reviewed documented beta blocker date and time   History of Anesthesia Complications Negative for: history of anesthetic complications  Airway Mallampati: II  TM Distance: >3 FB     Dental no notable dental hx.    Pulmonary neg shortness of breath, neg COPD, Current Smoker and Patient abstained from smoking.   breath sounds clear to auscultation       Cardiovascular hypertension, (-) angina + Peripheral Vascular Disease  (-) CAD, (-) Past MI, (-) Cardiac Stents, (-) CHF and (-) Orthopnea  Rhythm:Regular Rate:Normal     Neuro/Psych neg Seizures    GI/Hepatic ,GERD  ,,(+) neg Cirrhosis        Endo/Other    Renal/GU Renal disease     Musculoskeletal  (+) Arthritis ,    Abdominal   Peds  Hematology   Anesthesia Other Findings   Reproductive/Obstetrics                             Anesthesia Physical Anesthesia Plan  ASA: 3  Anesthesia Plan: General   Post-op Pain Management:    Induction: Intravenous  PONV Risk Score and Plan: 1 and Ondansetron and Dexamethasone  Airway Management Planned: Oral ETT  Additional Equipment:   Intra-op Plan:   Post-operative Plan: Extubation in OR  Informed Consent: I have reviewed the patients History and Physical, chart, labs and discussed the procedure including the risks, benefits and alternatives for the proposed anesthesia with the patient or authorized representative who has indicated his/her understanding and acceptance.     Dental advisory given  Plan Discussed with: CRNA  Anesthesia Plan Comments:        Anesthesia Quick Evaluation

## 2023-01-23 NOTE — Progress Notes (Signed)
Called report to RN at short-stay unit.   Lawson Radar, RN

## 2023-01-23 NOTE — Op Note (Signed)
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 male who presented to the hospital with a 2-day history of right upper extremity pain with paresthesias.  He was placed on heparin with significant improvement in both.  Imaging demonstrated nonocclusive axillary artery thrombus, brachial artery thrombus that was occlusive that extended into the radial and ulnar arteries.  After discussing the risk and benefits of right arm brachial embolectomy to restore perfusion to the hand, he elected to proceed.  FINDINGS:   Thrombosis of the brachial artery at the brachial bifurcation extending into the radial artery and ulnar arteries.  Thrombus appeared old with a cap.  This was sent to pathology  TECHNIQUE:   Patient is brought to the OR laid in supine position.  General anesthesia was induced and the patient was prepped and draped in standard fashion.  An ultrasound was used to insonate the right brachial artery with thrombus appreciated at the brachial artery bifurcation.  A longitudinal incision was made on the arm immediately distal to the antecubital fossa and the brachial artery was exposed and controlled with use of Vesseloops.  The radial and ulnar artery were also controlled with use of Vesseloops.  The patient was heparinized and a transverse arteriotomy made on the brachial artery at the bifurcation.  #3 Fogarty embolectomy catheter was sent both proximally and distally.  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  There was chronic thrombus also  appreciated from the ulnar artery.  There is healthy backbleeding.  Multiple passes were made in each until there were 3 clean passes each.  Next, the #3 Fogarty embolectomy catheter was then proximally extending through the axillary artery.  There was a significant amount of thrombus removed by embolectomy.  3 clean passes were made prior to clamping the artery.  There was excellent, pulsatile inflow and completion.  The transverse arteriotomy was closed using five 6-0 Prolene simple sutures.  Arteries were backbled prior to tying the last suture.  Upon completion, there was a multiphasic radial and ulnar signal in the hand.  Wound was irrigated with copious amounts of saline and closed in layers using 3-0 Vicryl suture with Monocryl and Dermabond at the level of the skin.  At case completion, there was a palpable radial pulse.  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 male who presented to the hospital with a 2-day history of right upper extremity pain with paresthesias.  He was placed on heparin with significant improvement in both.  Imaging demonstrated nonocclusive axillary artery thrombus, brachial artery thrombus that was occlusive that extended into the radial and ulnar arteries.  After discussing the risk and benefits of right arm brachial embolectomy to restore perfusion to the hand, he elected to proceed.  FINDINGS:   Thrombosis of the brachial artery at the brachial bifurcation extending into the radial artery and ulnar arteries.  Thrombus appeared old with a cap.  This was sent to pathology  TECHNIQUE:   Patient is brought to the OR laid in supine position.  General anesthesia was induced and the patient was prepped and draped in standard fashion.  An ultrasound was used to insonate the right brachial artery with thrombus appreciated at the brachial artery bifurcation.  A longitudinal incision was made on the arm immediately distal to the antecubital fossa and the brachial artery was exposed and controlled with use of Vesseloops.  The radial and ulnar artery were also controlled with use of Vesseloops.  The patient was heparinized and a transverse arteriotomy made on the brachial artery at the bifurcation.  #3 Fogarty embolectomy catheter was sent both proximally and distally.  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  There was chronic thrombus also  appreciated from the ulnar artery.  There is healthy backbleeding.  Multiple passes were made in each until there were 3 clean passes each.  Next, the #3 Fogarty embolectomy catheter was then proximally extending through the axillary artery.  There was a significant amount of thrombus removed by embolectomy.  3 clean passes were made prior to clamping the artery.  There was excellent, pulsatile inflow and completion.  The transverse arteriotomy was closed using five 6-0 Prolene simple sutures.  Arteries were backbled prior to tying the last suture.  Upon completion, there was a multiphasic radial and ulnar signal in the hand.  Wound was irrigated with copious amounts of saline and closed in layers using 3-0 Vicryl suture with Monocryl and Dermabond at the level of the skin.  At case completion, there was a palpable radial pulse.  Victorino Sparrow, MD [facility] DATE OF DICTATION:   01/23/2023

## 2023-01-23 NOTE — Progress Notes (Signed)
ANTICOAGULATION CONSULT NOTE - Follow Up Consult  Pharmacy Consult for Heparin Indication: arterial thrombus  No Known Allergies  Patient Measurements: Height: 5' 11.5" (181.6 cm) Weight: 110.7 kg (244 lb) IBW/kg (Calculated) : 76.45 Heparin Dosing Weight: 100.1 kg  Vital Signs: Temp: 98 F (36.7 C) (09/30 1741) Temp Source: Oral (09/30 1741) BP: 139/79 (09/30 1741) Pulse Rate: 73 (09/30 1741)  Labs: Recent Labs    01/22/23 1323 01/23/23 0148 01/23/23 1055  HGB 13.6 12.6*  --   HCT 40.8 38.6*  --   PLT 287 260  --   LABPROT 14.4  --   --   INR 1.1  --   --   HEPARINUNFRC  --  0.23* 0.41  CREATININE 1.16 1.17  --     Estimated Creatinine Clearance: 115.6 mL/min (by C-G formula based on SCr of 1.17 mg/dL).   Medications:  Scheduled:   fentaNYL       sodium chloride flush  3 mL Intravenous Q12H   Infusions:   heparin 1,900 Units/hr (01/23/23 1505)   PRN: fentaNYL, mouth rinse, oxyCODONE **OR** oxyCODONE  Assessment: 32yo male on heparin with initial dosing for arterial thrombus. Patient is now s/p embolectomy.  Per d/w Dr. Chestine Spore, ok to resume heparin 8hr after procedure without a bolus - will resume 1900 units/hr at 01:00 on 01/24/23 as heparin level was therapeutic (0.41) this morning on this rate.  Goal of Therapy:  Heparin level 0.3-0.7 units/ml Monitor platelets by anticoagulation protocol: Yes   Plan:  At 01:00 on 01/23/22, Start heparin infusion at 1900 units/hr Check anti-Xa level in 6 hours and daily while on heparin Continue to monitor H&H and platelets  Loralee Pacas, PharmD, BCPS 01/23/2023,6:20 PM  Please check AMION for all [facility] phone numbers After 10:00 PM, call Main Pharmacy [phone]

## 2023-01-23 NOTE — Progress Notes (Signed)
OT Cancellation Note  Patient Details Name: Shadrick Senne MRN: [id number] DOB: 02-01-1991   Cancelled Treatment:    Reason Eval/Treat Not Completed: Patient at procedure or test/ unavailable.  Norah Fick D Khalis Hittle 01/23/2023, 1:30 PM 01/23/2023  RP, OTR/L  Acute [facility] Services  Office:  [phone]

## 2023-01-23 NOTE — ED Notes (Signed)
ED TO INPATIENT HANDOFF REPORT  ED Nurse Name and Phone #: Pearletha Forge RN [phone]  S Name/Age/Gender Hayden Moore 32 y.o. male Room/Bed: 015C/015C  Code Status   Code Status: Full Code  Home/SNF/Other Home Patient oriented to: self, place, time, and situation Is this baseline? 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 Denies injury.    Allergies No Known Allergies  Level of Care/Admitting Diagnosis ED Disposition     ED Disposition  Admit   Condition  --   Comment  Hospital Area: [facility] [[id number]]  Level of Care: Progressive [102]  Admit to Progressive based on following criteria: CARDIOVASCULAR & THORACIC of moderate stability with acute coronary syndrome symptoms/low risk myocardial infarction/hypertensive urgency/arrhythmias/heart failure potentially compromising stability and stable post cardiovascular intervention patients.  May admit patient to [facility] or [facility] if equivalent level of care is available:: No  Covid Evaluation: Asymptomatic - no recent exposure (last 10 days) testing not required  Diagnosis: Arterial embolism of upper extremity [facility]) [[id number]]  Admitting Physician: Dolly Rias [[id number]]  Attending Physician: Dolly Rias [[id number]]  Certification:: I certify this patient will need inpatient services for at least 2 midnights  Expected Medical Readiness: 01/24/2023          B Medical/Surgery History Past Medical History:  Diagnosis Date   Arthritis    GERD (gastroesophageal reflux disease)    Hypertension    History reviewed. No pertinent surgical history.   A IV Location/Drains/Wounds Patient Lines/Drains/Airways Status     Active Line/Drains/Airways     Name Placement date Placement time Site Days   Peripheral IV 01/22/23 20 G  Anterior;Distal;Right;Upper Arm 01/22/23  1326  Arm  1   Peripheral IV 01/22/23 22 G 1" Left Antecubital 01/22/23  1544  Antecubital  1            Intake/Output Last 24 hours No intake or output data in the 24 hours ending 01/23/23 0434  Labs/Imaging Results for orders placed or performed during the hospital encounter of 01/22/23 (from the past 48 hour(s))  CBC with Differential     Status: None   Collection Time: 01/22/23  1:23 PM  Result Value Ref Range   WBC 5.6 4.0 - 10.5 K/uL   RBC 4.51 4.22 - 5.81 MIL/uL   Hemoglobin 13.6 13.0 - 17.0 g/dL   HCT 46.9 62.9 - 52.8 %   MCV 90.5 80.0 - 100.0 fL   MCH 30.2 26.0 - 34.0 pg   MCHC 33.3 30.0 - 36.0 g/dL   RDW 41.3 24.4 - 01.0 %   Platelets 287 150 - 400 K/uL   nRBC 0.0 0.0 - 0.2 %   Neutrophils Relative % 60 %   Neutro Abs 3.3 1.7 - 7.7 K/uL   Lymphocytes Relative 21 %   Lymphs Abs 1.2 0.7 - 4.0 K/uL   Monocytes Relative 14 %   Monocytes Absolute 0.8 0.1 - 1.0 K/uL   Eosinophils Relative 4 %   Eosinophils Absolute 0.2 0.0 - 0.5 K/uL   Basophils Relative 1 %   Basophils Absolute 0.0 0.0 - 0.1 K/uL   Immature Granulocytes 0 %   Abs Immature Granulocytes 0.01 0.00 - 0.07 K/uL    Comment: Performed at [facility], [address], [place], [place] [address]  Protime-INR     Status: None  Collection Time: 01/22/23  1:23 PM  Result Value Ref Range   Prothrombin Time 14.4 11.4 - 15.2 seconds   INR 1.1 0.8 - 1.2    Comment: (NOTE) INR goal varies based on device and disease states. Performed at [facility], [address], [place], [place] [address]   Comprehensive metabolic panel     Status: Abnormal   Collection Time: 01/22/23  1:23 PM  Result Value Ref Range   Sodium 138 135 - 145 mmol/L   Potassium 3.5 3.5 - 5.1 mmol/L   Chloride 103 98 - 111 mmol/L   CO2 24 22 - 32 mmol/L   Glucose, Bld 98 70 - 99 mg/dL    Comment: Glucose reference range applies only to samples taken after fasting for at least 8  hours.   BUN 10 6 - 20 mg/dL   Creatinine, Ser 1.32 0.61 - 1.24 mg/dL   Calcium 8.9 8.9 - 44.0 mg/dL   Total Protein 6.7 6.5 - 8.1 g/dL   Albumin 3.1 (L) 3.5 - 5.0 g/dL   AST 53 (H) 15 - 41 U/L   ALT 48 (H) 0 - 44 U/L   Alkaline Phosphatase 93 38 - 126 U/L   Total Bilirubin 1.0 0.3 - 1.2 mg/dL   GFR, Estimated >10 >27 mL/min    Comment: (NOTE) Calculated using the CKD-EPI Creatinine Equation (2021)    Anion gap 11 5 - 15    Comment: Performed at [facility], [address] [place], [place] [address]  Heparin level (unfractionated)     Status: Abnormal   Collection Time: 01/23/23  1:48 AM  Result Value Ref Range   Heparin Unfractionated 0.23 (L) 0.30 - 0.70 IU/mL    Comment: (NOTE) The clinical reportable range upper limit is being lowered to >1.10 to align with the FDA approved guidance for the current laboratory assay.  If heparin results are below expected values, and patient dosage has  been confirmed, suggest follow up testing of antithrombin III levels. Performed at [facility], [address] [place], [place] [address]   CBC     Status: Abnormal   Collection Time: 01/23/23  1:48 AM  Result Value Ref Range   WBC 6.7 4.0 - 10.5 K/uL   RBC 4.28 4.22 - 5.81 MIL/uL   Hemoglobin 12.6 (L) 13.0 - 17.0 g/dL   HCT 74.2 (L) 59.5 - 63.8 %   MCV 90.2 80.0 - 100.0 fL   MCH 29.4 26.0 - 34.0 pg   MCHC 32.6 30.0 - 36.0 g/dL   RDW 75.6 43.3 - 29.5 %   Platelets 260 150 - 400 K/uL   nRBC 0.0 0.0 - 0.2 %    Comment: Performed at [facility], [address], [place], [place] [address]  HIV Antibody (routine testing w rflx)     Status: None   Collection Time: 01/23/23  1:48 AM  Result Value Ref Range   HIV Screen 4th Generation wRfx Non Reactive Non Reactive    Comment: Performed at [facility], [address], [place], [place] [address]  Basic metabolic panel     Status: Abnormal   Collection Time: 01/23/23  1:48 AM  Result Value Ref Range   Sodium  135 135 - 145 mmol/L   Potassium 3.2 (L) 3.5 - 5.1 mmol/L   Chloride 101 98 - 111 mmol/L   CO2 26 22 - 32 mmol/L   Glucose, Bld 118 (H) 70 -  99 mg/dL    Comment: Glucose reference range applies only to samples taken after fasting for at least 8 hours.   BUN 8 6 - 20 mg/dL   Creatinine, Ser 1.61 0.61 - 1.24 mg/dL   Calcium 8.5 (L) 8.9 - 10.3 mg/dL   GFR, Estimated >09 >60 mL/min    Comment: (NOTE) Calculated using the CKD-EPI Creatinine Equation (2021)    Anion gap 8 5 - 15    Comment: Performed at [facility], [address], [place], [place] [address]  Magnesium     Status: None   Collection Time: 01/23/23  1:48 AM  Result Value Ref Range   Magnesium 2.1 1.7 - 2.4 mg/dL    Comment: Performed at [facility], [address] [place], [place] [address]  Phosphorus     Status: None   Collection Time: 01/23/23  1:48 AM  Result Value Ref Range   Phosphorus 3.0 2.5 - 4.6 mg/dL    Comment: Performed at [facility], [address] [place], [place] [address]  Sedimentation rate     Status: None   Collection Time: 01/23/23  1:48 AM  Result Value Ref Range   Sed Rate 5 0 - 16 mm/hr    Comment: Performed at [facility], [address], [place], [place] [address]  C-reactive protein     Status: Abnormal   Collection Time: 01/23/23  1:48 AM  Result Value Ref Range   CRP 1.3 (H) <1.0 mg/dL    Comment: Performed at [facility], [address] [place], [place] [address]  SARS Coronavirus 2 by RT PCR (hospital order, performed in [facility] hospital lab) *cepheid single result test* Anterior Nasal Swab     Status: None   Collection Time: 01/23/23  1:48 AM   Specimen: Anterior Nasal Swab  Result Value Ref Range   SARS Coronavirus 2 by RT PCR NEGATIVE NEGATIVE    Comment: Performed at [facility], [address] [place], [place] [address]   CT CHEST ABDOMEN PELVIS W CONTRAST  Result Date: 01/23/2023 CLINICAL DATA:  Lymphadenopathy. Right upper  extremity arterial thrombus. Numbness and tingling to the right hand for 2 days. No trauma. EXAM: CT CHEST, ABDOMEN, AND PELVIS WITH CONTRAST TECHNIQUE: Multidetector CT imaging of the chest, abdomen and pelvis was performed following the standard protocol during bolus administration of intravenous contrast. RADIATION DOSE REDUCTION: This exam was performed according to the departmental dose-optimization program which includes automated exposure control, adjustment of the mA and/or kV according to patient size and/or use of iterative reconstruction technique. CONTRAST:  75mL OMNIPAQUE IOHEXOL 350 MG/ML SOLN COMPARISON:  CT abdomen and pelvis 10/08/2020 FINDINGS: CT CHEST FINDINGS Cardiovascular: Normal heart size. No pericardial effusions. Normal caliber thoracic aorta. Mediastinum/Nodes: Esophagus is decompressed. Prominent mediastinal lymphadenopathy with right paratracheal nodes measuring up to 2.1 cm short axis dimension and subcarinal nodes measuring up to 2.3 cm short axis dimension. Bilateral hilar lymphadenopathy. Thyroid gland is unremarkable. Lungs/Pleura: Multiple focal areas of patchy infiltration in the lungs with somewhat nodular pattern and with mostly peribronchial/perivascular distribution. No pleural effusions. No pneumothorax. Musculoskeletal: No focal bone lesions. CT ABDOMEN PELVIS FINDINGS Hepatobiliary: No focal liver abnormality is seen. No gallstones, gallbladder wall thickening, or biliary dilatation. Pancreas: Unremarkable. No pancreatic ductal dilatation or surrounding inflammatory changes. Spleen: Normal in size without focal abnormality. Adrenals/Urinary Tract: Adrenal glands are unremarkable. Kidneys are normal, without renal calculi, focal lesion, or hydronephrosis. Bladder is  unremarkable. Stomach/Bowel: Stomach is within normal limits. Appendix appears normal. No evidence of bowel wall thickening, distention, or inflammatory changes. Vascular/Lymphatic: No significant  lymphadenopathy in the abdomen or pelvis. Normal caliber abdominal aorta. Reproductive: Prostate is unremarkable. Other: No abdominal wall hernia or abnormality. No abdominopelvic ascites. Musculoskeletal: No focal bone lesions. IMPRESSION: 1. Mediastinal and bilateral hilar lymphadenopathy. This is likely lymphoma although metastatic disease could also have this appearance. 2. Patchy focal airspace disease throughout both lungs. This could represent ground-glass metastatic disease, multifocal pneumonia, or septic emboli. 3. No evidence of lymphadenopathy or metastatic disease in the abdomen or pelvis. Electronically Signed   By: Burman Nieves M.D.   On: 01/23/2023 00:26   CT ANGIO UP EXTREM RIGHT W &/OR WO CONTRAST  Addendum Date: 01/22/2023   ADDENDUM REPORT: 01/22/2023 16:38 ADDENDUM: Findings reported by telephone to Dr. Wilkie Aye, 4:35 p.m. Electronically Signed   By: Jearld Lesch M.D.   On: 01/22/2023 16:38   Result Date: 01/22/2023 CLINICAL DATA:  Diminished pulses, claudication * Tracking Code: BO * EXAM: CT ANGIOGRAPHY UPPER RIGHT EXTREMITY TECHNIQUE: CT angiogram of the right upper extremity was obtained in the arterial phase of contrast administration. CONTRAST:  OMNIPAQUE IOHEXOL 350 MG/ML SOLN COMPARISON:  None Available. FINDINGS: Vascular: Normal contour and caliber of the right upper extremity vasculature. No significant atherosclerosis. 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 The vessel remains occluded through the division of the radial, ulnar, and interosseous arteries, with reconstitution of the branch vessels below the elbow (series 12, image 27). Branch vessels remain patent to the wrist. Vessels of the hand and palmar arch are not well  appreciated, probably due to early phase of contrast. Osseous and musculoskeletal: No fracture or dislocation. Soft tissues unremarkable. Partially included chest: Right hilar and mediastinal lymphadenopathy (series 18, image 78). Lower cervical and supraclavicular lymphadenopathy. Heterogeneous airspace disease throughout the partially imaged right lung. Review of the MIP images confirms the above findings. IMPRESSION: 1. 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 2. 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 The vessel remains occluded through the division of the radial, ulnar, and interosseous arteries, with reconstitution of the branch vessels below the elbow. 3. Branch vessels of the right forearm remain patent to the wrist. Vessels of the hand and palmar arch are not well appreciated, probably due to early phase of contrast. 4. Right hilar, mediastinal, and cervical lymphadenopathy, concerning for malignancy. 5. Heterogeneous airspace disease throughout the partially imaged right lung. Consider dedicated imaging of the chest. Call report request was placed at the time of interpretation. Report issued at this time in the interest of expedient communication. Final communication of critical findings will be documented. Electronically Signed: By: Jearld Lesch M.D. On: 01/22/2023 16:31    Pending Labs Unresulted Labs (From admission, onward)     Start     Ordered   01/24/23 0500  Heparin level (unfractionated)  Daily,   R      01/22/23 1856   01/24/23 0500  CK  Tomorrow morning,   R        01/23/23 0055   01/23/23 0900  Heparin level (unfractionated)  Once-Timed,  TIMED        01/23/23 0253   01/23/23 0500  CBC  Daily,   R      01/22/23 1856   01/23/23 0500  ANA w/Reflex if Positive  Tomorrow morning,   R        01/22/23 2302   01/23/23 0500  Rheumatoid  factor  Tomorrow morning,   R        01/22/23 2302   01/23/23 0500  C3 complement  Tomorrow morning,   R        01/22/23 2302   01/23/23 0500  C4 complement  Tomorrow morning,   R        01/22/23 2302   01/23/23 0500  Antiphospholipid syndrome eval, bld  Tomorrow morning,   R        01/22/23 2302   01/22/23 2258  Urinalysis, Routine w reflex microscopic -Urine, Clean Catch  Once,   R       Question:  Specimen Source  Answer:  Urine, Clean Catch   01/22/23 2302   01/22/23 2258  Protein / creatinine ratio, urine  Once,   R        01/22/23 2302            Vitals/Pain Today's Vitals   01/23/23 0300 01/23/23 0315 01/23/23 0330 01/23/23 0400  BP: 130/76  116/65 125/78  Pulse: (!) 44  (!) 47 63  Resp: (!) 27  (!) 25 (!) 22  Temp:      TempSrc:      SpO2: 100%  100% 100%  Weight:      Height:      PainSc:  0-No pain      Isolation Precautions No active isolations  Medications Medications  heparin ADULT infusion 100 units/mL (25000 units/243mL) (1,900 Units/hr Intravenous New Bag/Given (Non-Interop) 01/23/23 0430)  sodium chloride flush (NS) 0.9 % injection 3 mL (3 mLs Intravenous Given 01/23/23 0201)  iohexol (OMNIPAQUE) 350 MG/ML injection 125 mL (125 mLs Intravenous Contrast Given 01/22/23 1603)  sodium chloride 0.9 % bolus 1,000 mL (0 mLs Intravenous Stopped 01/22/23 1954)  heparin bolus via infusion 5,500 Units (5,500 Units Intravenous Bolus from Bag 01/22/23 1920)  iohexol (OMNIPAQUE) 350 MG/ML injection 75 mL (75 mLs Intravenous Contrast Given 01/23/23 0017)  heparin bolus via infusion 3,000 Units (3,000 Units Intravenous Bolus from Bag 01/23/23 0313)    Mobility walks     Focused Assessments Cardiac Assessment Handoff:  Cardiac Rhythm: Normal sinus rhythm No results found for: "CKTOTAL", "CKMB", "CKMBINDEX", "TROPONINI" No results found for: "DDIMER" Does the Patient currently have chest pain? No    R Recommendations: See Admitting Provider Note  Report given  to:   Additional Notes:

## 2023-01-23 NOTE — Anesthesia Procedure Notes (Signed)
Procedure Name: Intubation Date/Time: 01/23/2023 3:23 PM  Performed by: April Holding, CRNAPre-anesthesia Checklist: Patient identified, Emergency Drugs available, Suction available and Patient being monitored Patient Re-evaluated:Patient Re-evaluated prior to induction Oxygen Delivery Method: Circle System Utilized Preoxygenation: Pre-oxygenation with 100% oxygen Induction Type: IV induction Ventilation: Mask ventilation without difficulty Laryngoscope Size: Miller and 2 Grade View: Grade II Tube type: Oral Tube size: 8.0 mm Number of attempts: 1 Airway Equipment and Method: Stylet and Oral airway Placement Confirmation: ETT inserted through vocal cords under direct vision, positive ETCO2 and breath sounds checked- equal and bilateral Secured at: 24 cm Tube secured with: Tape Dental Injury: Teeth and Oropharynx as per pre-operative assessment

## 2023-01-23 NOTE — Progress Notes (Signed)
PROGRESS NOTE    Hayden Moore  HQI:[id number] DOB: 04/06/1991 DOA: 01/22/2023 PCP: Georganna Skeans, MD    Brief Narrative:  32 year old with history of hypertension, smoker who presents to the hospital with 3 days of cool, painful and loss of sensation of right upper extremity, he woke up from sleep like that.  No previous history of thromboembolism.  No history of trauma.  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  Reported transient vision loss 3 months ago but improved now.  In the emergency room hemodynamically stable.  Loss of sensation medial right elbow, intact vasculature.  CT angiogram demonstrated thrombus within the axillary artery as well as the brachial bifurcation.  Started on heparin infusion.  Admitted with vascular surgery consultation. Patient does endorse 40 pound weight loss in 2 months.  Denies any night sweats or decreased appetite.  Still working actively.   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 Scheduled for arterial thrombectomy today. Hypercoagulable panels sent, results pending Echocardiogram to look for any source of embolism  Thoracic and cervical lymphadenopathy: CT angiogram visualized hilar mediastinal cervical lymphadenopathy.  Heterogenous suspicious disease in the right lung.  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  Differential diagnosis includes sarcoid and malignancy CT scan of chest abdomen and pelvis is done that shows cervical, mediastinal and bilateral hilar lymphadenopathy, suspected lymphoma, patchy focal airspace disease throughout the lungs?  Septic emboli.  No evidence of lymphadenopathy or metastatic disease in the abdomen or pelvis.  Will also draw blood cultures COVID-negative ESR, CRP, ANA, C3/C4, rheumatoid factor, antiphospholipid antibody pending. Will discuss case with oncology. Cervical lymph nodes are not palpable but likely  amenable to biopsy under IR.  If patient needs excisional biopsy, will need surgical biopsy.  Hypokalemia: Replaced.   DVT prophylaxis: Heparin infusion   Code Status: Full code Family Communication: None Disposition Plan: Status is: Inpatient Remains inpatient appropriate because: On heparin infusion, going for procedure     Consultants:  Vascular surgery  Procedures:  None  Antimicrobials:  None   Subjective: Patient seen in the morning rounds.  He still has tingling and numbness on the right middle ring and pinky fingers.  Denies any other complaints.  Aware about the procedure.  Objective: Vitals:   01/23/23 0330 01/23/23 0400 01/23/23 0430 01/23/23 0513  BP: 116/65 125/78 127/80 (!) 150/80  Pulse: (!) 47 63 (!) 41 (!) 52  Resp: (!) 25 (!) 22 (!) 23 20  Temp:   97.9 F (36.6 C) 98 F (36.7 C)  TempSrc:   Oral Oral  SpO2: 100% 100% 100% 97%  Weight:    111.2 kg  Height:    5\' 11"  (1.803 m)    Intake/Output Summary (Last 24 hours) at 01/23/2023 0743 Last data filed at 01/23/2023 0500 Gross per 24 hour  Intake 0 ml  Output 450 ml  Net -450 ml   Filed Weights   01/22/23 1842 01/22/23 1934 01/23/23 0513  Weight: 112 kg 112 kg 111.2 kg    Examination:  General exam: Appears calm and comfortable  Alert awake and oriented.  He does not have any palpable cervical lymphadenopathy. Respiratory system: Clear to auscultation. Respiratory effort normal. Cardiovascular system: S1 & S2 heard, RRR.  No pedal edema. Has adequate vasculature of the extremities.  No color changes. Gastrointestinal system: Abdomen is nondistended, soft and nontender. No organomegaly or masses felt. Normal bowel sounds heard. Central nervous system: Alert and oriented. No focal neurological  deficits. Extremities: Symmetric 5 x 5 power. Skin: No rashes, lesions or ulcers Psychiatry: Judgement and insight appear normal. Mood & affect appropriate.     Data Reviewed: I have personally  reviewed following labs and imaging studies  CBC: Recent Labs  Lab 01/22/23 1323 01/23/23 0148  WBC 5.6 6.7  NEUTROABS 3.3  --   HGB 13.6 12.6*  HCT 40.8 38.6*  MCV 90.5 90.2  PLT 287 260   Basic Metabolic Panel: Recent Labs  Lab 01/22/23 1323 01/23/23 0148  NA 138 135  K 3.5 3.2*  CL 103 101  CO2 24 26  GLUCOSE 98 118*  BUN 10 8  CREATININE 1.16 1.17  CALCIUM 8.9 8.5*  MG  --  2.1  PHOS  --  3.0   GFR: Estimated Creatinine Clearance: 115 mL/min (by C-G formula based on SCr of 1.17 mg/dL). Liver Function Tests: Recent Labs  Lab 01/22/23 1323  AST 53*  ALT 48*  ALKPHOS 93  BILITOT 1.0  PROT 6.7  ALBUMIN 3.1*   No results for input(s): "LIPASE", "AMYLASE" in the last 168 hours. No results for input(s): "AMMONIA" in the last 168 hours. Coagulation Profile: Recent Labs  Lab 01/22/23 1323  INR 1.1   Cardiac Enzymes: No results for input(s): "CKTOTAL", "CKMB", "CKMBINDEX", "TROPONINI" in the last 168 hours. BNP (last 3 results) No results for input(s): "PROBNP" in the last 8760 hours. HbA1C: No results for input(s): "HGBA1C" in the last 72 hours. CBG: No results for input(s): "GLUCAP" in the last 168 hours. Lipid Profile: No results for input(s): "CHOL", "HDL", "LDLCALC", "TRIG", "CHOLHDL", "LDLDIRECT" in the last 72 hours. Thyroid Function Tests: No results for input(s): "TSH", "T4TOTAL", "FREET4", "T3FREE", "THYROIDAB" in the last 72 hours. Anemia Panel: No results for input(s): "VITAMINB12", "FOLATE", "FERRITIN", "TIBC", "IRON", "RETICCTPCT" in the last 72 hours. Sepsis Labs: No results for input(s): "PROCALCITON", "LATICACIDVEN" in the last 168 hours.  Recent Results (from the past 240 hour(s))  SARS Coronavirus 2 by RT PCR (hospital order, performed in [facility] hospital lab) *cepheid single result test* Anterior Nasal Swab     Status: None   Collection Time: 01/23/23  1:48 AM   Specimen: Anterior Nasal Swab  Result Value Ref Range Status    SARS Coronavirus 2 by RT PCR NEGATIVE NEGATIVE Final    Comment: Performed at [facility], [address] [place], [place] [address]  Surgical PCR screen     Status: None   Collection Time: 01/23/23  5:40 AM   Specimen: Nasal Mucosa; Nasal Swab  Result Value Ref Range Status   MRSA, PCR NEGATIVE NEGATIVE Final   Staphylococcus aureus NEGATIVE NEGATIVE Final    Comment: (NOTE) The Xpert SA Assay (FDA approved for NASAL specimens in patients 88 years of age and older), is one component of a comprehensive surveillance program. It is not intended to diagnose infection nor to guide or monitor treatment. Performed at [facility], [address] [place], [place] [address]          Radiology Studies: CT CHEST ABDOMEN PELVIS W CONTRAST  Result Date: 01/23/2023 CLINICAL DATA:  Lymphadenopathy. Right upper extremity arterial thrombus. Numbness and tingling to the right hand for 2 days. No trauma. EXAM: CT CHEST, ABDOMEN, AND PELVIS WITH CONTRAST TECHNIQUE: Multidetector CT imaging of the chest, abdomen and pelvis was performed following the standard protocol during bolus administration of intravenous contrast. RADIATION DOSE REDUCTION: This exam was performed according to the departmental dose-optimization program which includes  automated exposure control, adjustment of the mA and/or kV according to patient size and/or use of iterative reconstruction technique. CONTRAST:  75mL OMNIPAQUE IOHEXOL 350 MG/ML SOLN COMPARISON:  CT abdomen and pelvis 10/08/2020 FINDINGS: CT CHEST FINDINGS Cardiovascular: Normal heart size. No pericardial effusions. Normal caliber thoracic aorta. Mediastinum/Nodes: Esophagus is decompressed. Prominent mediastinal lymphadenopathy with right paratracheal nodes measuring up to 2.1 cm short axis dimension and subcarinal nodes measuring up to 2.3 cm short axis dimension. Bilateral hilar lymphadenopathy. Thyroid gland is unremarkable. Lungs/Pleura: Multiple focal  areas of patchy infiltration in the lungs with somewhat nodular pattern and with mostly peribronchial/perivascular distribution. No pleural effusions. No pneumothorax. Musculoskeletal: No focal bone lesions. CT ABDOMEN PELVIS FINDINGS Hepatobiliary: No focal liver abnormality is seen. No gallstones, gallbladder wall thickening, or biliary dilatation. Pancreas: Unremarkable. No pancreatic ductal dilatation or surrounding inflammatory changes. Spleen: Normal in size without focal abnormality. Adrenals/Urinary Tract: Adrenal glands are unremarkable. Kidneys are normal, without renal calculi, focal lesion, or hydronephrosis. Bladder is unremarkable. Stomach/Bowel: Stomach is within normal limits. Appendix appears normal. No evidence of bowel wall thickening, distention, or inflammatory changes. Vascular/Lymphatic: No significant lymphadenopathy in the abdomen or pelvis. Normal caliber abdominal aorta. Reproductive: Prostate is unremarkable. Other: No abdominal wall hernia or abnormality. No abdominopelvic ascites. Musculoskeletal: No focal bone lesions. IMPRESSION: 1. Mediastinal and bilateral hilar lymphadenopathy. This is likely lymphoma although metastatic disease could also have this appearance. 2. Patchy focal airspace disease throughout both lungs. This could represent ground-glass metastatic disease, multifocal pneumonia, or septic emboli. 3. No evidence of lymphadenopathy or metastatic disease in the abdomen or pelvis. Electronically Signed   By: Burman Nieves M.D.   On: 01/23/2023 00:26   CT ANGIO UP EXTREM RIGHT W &/OR WO CONTRAST  Addendum Date: 01/22/2023   ADDENDUM REPORT: 01/22/2023 16:38 ADDENDUM: Findings reported by telephone to Dr. Wilkie Aye, 4:35 p.m. Electronically Signed   By: Jearld Lesch M.D.   On: 01/22/2023 16:38   Result Date: 01/22/2023 CLINICAL DATA:  Diminished pulses, claudication * Tracking Code: BO * EXAM: CT ANGIOGRAPHY UPPER RIGHT EXTREMITY TECHNIQUE: CT angiogram of the right  upper extremity was obtained in the arterial phase of contrast administration. CONTRAST:  OMNIPAQUE IOHEXOL 350 MG/ML SOLN COMPARISON:  None Available. FINDINGS: Vascular: Normal contour and caliber of the right upper extremity vasculature. No significant atherosclerosis. 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 The vessel remains occluded through the division of the radial, ulnar, and interosseous arteries, with reconstitution of the branch vessels below the elbow (series 12, image 27). Branch vessels remain patent to the wrist. Vessels of the hand and palmar arch are not well appreciated, probably due to early phase of contrast. Osseous and musculoskeletal: No fracture or dislocation. Soft tissues unremarkable. Partially included chest: Right hilar and mediastinal lymphadenopathy (series 18, image 78). Lower cervical and supraclavicular lymphadenopathy. Heterogeneous airspace disease throughout the partially imaged right lung. Review of the MIP images confirms the above findings. IMPRESSION: 1. 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 2. 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 The vessel remains occluded through the division of the radial, ulnar, and interosseous arteries, with reconstitution of the branch vessels below  the elbow. 3. Branch vessels of the right forearm remain patent to the wrist. Vessels of the hand and palmar arch are not well appreciated, probably due to early phase of contrast. 4. Right hilar, mediastinal, and cervical lymphadenopathy, concerning for malignancy. 5. Heterogeneous airspace disease throughout  the partially imaged right lung. Consider dedicated imaging of the chest. Call report request was placed at the time of interpretation. Report issued at this time in the interest of expedient communication. Final communication of critical findings will be documented. Electronically Signed: By: Jearld Lesch M.D. On: 01/22/2023 16:31        Scheduled Meds:  mupirocin ointment  1 Application Nasal BID   sodium chloride flush  3 mL Intravenous Q12H   Continuous Infusions:  heparin 1,900 Units/hr (01/23/23 0430)     LOS: 1 day    Time spent: 40 minutes    Dorcas Carrow, MD [facility]

## 2023-01-23 NOTE — Progress Notes (Signed)
ANTICOAGULATION CONSULT NOTE - Follow Up Consult  Pharmacy Consult for heparin Indication:  arterial thrombus  Labs: Recent Labs    01/22/23 1323 01/23/23 0148  HGB 13.6 12.6*  HCT 40.8 38.6*  PLT 287 260  LABPROT 14.4  --   INR 1.1  --   HEPARINUNFRC  --  0.23*  CREATININE 1.16 1.17    Assessment: 32yo male subtherapeutic on heparin with initial dosing for arterial thrombus; no infusion issues or signs of bleeding per RN.  Goal of Therapy:  Heparin level 0.3-0.7 units/ml   Plan:  3000 units heparin bolus. Increase heparin infusion by 3 units/kg/hr to 1900 units/hr. Check level in 6 hours.   Vernard Gambles, PharmD, BCPS 01/23/2023 2:54 AM

## 2023-01-23 NOTE — Progress Notes (Signed)
ANTICOAGULATION CONSULT NOTE - Follow Up Consult  Pharmacy Consult for heparin Indication:  arterial thrombus  Labs: Recent Labs    01/22/23 1323 01/23/23 0148 01/23/23 1055  HGB 13.6 12.6*  --   HCT 40.8 38.6*  --   PLT 287 260  --   LABPROT 14.4  --   --   INR 1.1  --   --   HEPARINUNFRC  --  0.23* 0.41  CREATININE 1.16 1.17  --     Assessment: 32yo male on heparin with initial dosing for arterial thrombus; no infusion issues or signs of bleeding per RN. Heparin infusion 1900 uts/hr with heparin level 0.41 at goal   Goal of Therapy:  Heparin level 0.3-0.7 units/ml   Plan:  Continue  heparin infusion  1900 units/hr. Follow up after procedure today   Leota Sauers Pharm.D. CPP, BCPS Clinical Pharmacist [phone] 01/23/2023 3:21 PM

## 2023-01-23 NOTE — Anesthesia Postprocedure Evaluation (Signed)
Anesthesia Post Note  Patient: Hayden Moore  Procedure(s) Performed: ARM BRACHIAL EMBOLECTOMY (Right)     Patient location during evaluation: PACU Anesthesia Type: General Level of consciousness: awake and alert Pain management: pain level controlled Vital Signs Assessment: post-procedure vital signs reviewed and stable Respiratory status: spontaneous breathing, nonlabored ventilation, respiratory function stable and patient connected to nasal cannula oxygen Cardiovascular status: blood pressure returned to baseline and stable Postop Assessment: no apparent nausea or vomiting Anesthetic complications: no   No notable events documented.  Last Vitals:  Vitals:   01/23/23 1715 01/23/23 1741  BP: (!) 146/92 139/79  Pulse: 75 73  Resp: (!) 21 20  Temp:  36.7 C  SpO2: 93% 98%    Last Pain:  Vitals:   01/23/23 1741  TempSrc: Oral  PainSc: 10-Worst pain ever                 Mariann Barter

## 2023-01-23 NOTE — Plan of Care (Signed)
  Problem: Health Behavior/Discharge Planning: Goal: Ability to manage health-related needs will improve Outcome: Progressing   

## 2023-01-23 NOTE — Progress Notes (Addendum)
Pt came back to rm 1 from PACU on  heparin running at 19 ml/hr. Reinitiated tele. Vss. Call bell within reach.   Lawson Radar, RN

## 2023-01-24 ENCOUNTER — Inpatient Hospital Stay (HOSPITAL_COMMUNITY): Payer: Self-pay

## 2023-01-24 ENCOUNTER — Encounter (HOSPITAL_COMMUNITY): Payer: Self-pay | Admitting: Vascular Surgery

## 2023-01-24 DIAGNOSIS — I742 Embolism and thrombosis of arteries of the upper extremities: Secondary | ICD-10-CM

## 2023-01-24 LAB — CBC
HCT: 39.1 % (ref 39.0–52.0)
Hemoglobin: 12.7 g/dL — ABNORMAL LOW (ref 13.0–17.0)
MCH: 29 pg (ref 26.0–34.0)
MCHC: 32.5 g/dL (ref 30.0–36.0)
MCV: 89.3 fL (ref 80.0–100.0)
Platelets: 261 10*3/uL (ref 150–400)
RBC: 4.38 MIL/uL (ref 4.22–5.81)
RDW: 13.7 % (ref 11.5–15.5)
WBC: 10.4 10*3/uL (ref 4.0–10.5)
nRBC: 0 % (ref 0.0–0.2)

## 2023-01-24 LAB — ECHOCARDIOGRAM COMPLETE
Area-P 1/2: 3.48 cm2
Height: 71.5 in
S' Lateral: 4.4 cm
Weight: 3816 [oz_av]

## 2023-01-24 LAB — HEPARIN LEVEL (UNFRACTIONATED): Heparin Unfractionated: 0.24 [IU]/mL — ABNORMAL LOW (ref 0.30–0.70)

## 2023-01-24 LAB — C4 COMPLEMENT: Complement C4, Body Fluid: 30 mg/dL (ref 12–38)

## 2023-01-24 LAB — ANA W/REFLEX IF POSITIVE: Anti Nuclear Antibody (ANA): NEGATIVE

## 2023-01-24 LAB — RHEUMATOID FACTOR: Rheumatoid fact SerPl-aCnc: <10 [IU]/mL (ref <14.0)

## 2023-01-24 LAB — CK: Total CK: 345 U/L (ref 49–397)

## 2023-01-24 LAB — C3 COMPLEMENT: C3 Complement: 135 mg/dL (ref 82–167)

## 2023-01-24 MED ORDER — PERFLUTREN LIPID MICROSPHERE
1.0000 mL | INTRAVENOUS | Status: AC | PRN
  Administered 2023-01-24: 3 mL via INTRAVENOUS

## 2023-01-24 MED ORDER — LIDOCAINE HCL (PF) 1 % IJ SOLN: 5.0000 mL | Freq: Once | INTRAMUSCULAR | Status: DC

## 2023-01-24 MED ORDER — APIXABAN 5 MG PO TABS
5.0000 mg | ORAL_TABLET | Freq: Two times a day (BID) | ORAL | Status: DC
  Administered 2023-01-24 – 2023-01-25 (×3): 5 mg via ORAL
  Filled 2023-01-24 (×3): qty 1

## 2023-01-24 NOTE — Progress Notes (Signed)
ANTICOAGULATION CONSULT NOTE - Follow Up Consult  Pharmacy Consult for Heparin > apixaban Indication: arterial thrombus  No Known Allergies  Patient Measurements: Height: 5' 11.5" (181.6 cm) Weight: 108.2 kg (238 lb 8 oz) IBW/kg (Calculated) : 76.45 Heparin Dosing Weight: 100.1 kg  Vital Signs: Temp: 98.1 F (36.7 C) (10/01 1126) Temp Source: Oral (10/01 0812) BP: 148/81 (10/01 1126) Pulse Rate: 58 (10/01 1126)  Labs: Recent Labs    01/22/23 1323 01/23/23 0148 01/23/23 1055 01/24/23 0437 01/24/23 0835  HGB 13.6 12.6*  --  12.7*  --   HCT 40.8 38.6*  --  39.1  --   PLT 287 260  --  261  --   LABPROT 14.4  --   --   --   --   INR 1.1  --   --   --   --   HEPARINUNFRC  --  0.23* 0.41  --  0.24*  CREATININE 1.16 1.17  --   --   --   CKTOTAL  --   --   --  345  --     Estimated Creatinine Clearance: 114.4 mL/min (by C-G formula based on SCr of 1.17 mg/dL).   Medications:  Scheduled:   apixaban  5 mg Oral BID   lidocaine (PF)  5 mL Intradermal Once   sodium chloride flush  3 mL Intravenous Q12H   Infusions:    PRN: morphine injection, mouth rinse, oxyCODONE **OR** oxyCODONE  Assessment: 32yo male on heparin with initial dosing for arterial thrombus. Patient is now s/p embolectomy.  Per d/w Dr. Chestine Spore, ok to resume heparin 8hr after procedure without a bolus - resumed at 1900 units/hr at 01:00 on 01/24/23 Heparin level 0.24 this am < goal  But ok to convert to oral anticoagulation   Goal of Therapy:  Heparin level 0.3-0.7 units/ml Monitor platelets by anticoagulation protocol: Yes   Plan:  Stop heparin drip  Apixaban 5mg  BID   Leota Sauers Pharm.D. CPP, BCPS Clinical Pharmacist [phone] 01/24/2023 12:56 PM

## 2023-01-24 NOTE — Procedures (Signed)
Interventional Radiology Procedure:   Indications: Cervical and chest lymphadenopathy  Procedure: US guided core biopsy left cervical nodes  Findings: Small irregular cervical lymph nodes bilaterally.   Biopsied a group of small lymph nodes in left supraclavicular area.  5 core biopsies obtained.   Complications: None     EBL: Minimal  Plan: May resume heparin  Keonta Monceaux R. Lowella Dandy, MD  Pager: [phone]

## 2023-01-24 NOTE — Evaluation (Signed)
Occupational Therapy Evaluation Patient Details Name: Hayden Moore MRN: [id number] DOB: [birth year] Today's Date: 01/24/2023   History of Present Illness Hayden Moore is a 32 y.o. 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   Clinical Impression   Pt report PTA, he was independent with ADL/IADL and functional mobility. Pt reports he has 4 children at home. Pt reports sensation improving, reports tingling in tip of R 5th digit only. Pt demonstrates ability to complete ADL/IADL and functional mobility at independent level. Patient evaluated by Occupational Therapy with no further acute OT needs identified. All education has been completed and the patient has no further questions. See below for any follow-up Occupational Therapy or equipment needs. OT to sign off. Thank you for referral.         If plan is discharge home, recommend the following:      Functional Status Assessment  Patient has had a recent decline in their functional status and demonstrates the ability to make significant improvements in function in a reasonable and predictable amount of time.  Equipment Recommendations  None recommended by OT    Recommendations for Other Services       Precautions / Restrictions Precautions Precautions: Fall Restrictions Weight Bearing Restrictions: No      Mobility Bed Mobility Overal bed mobility: Independent                  Transfers Overall transfer level: Independent                        Balance Overall balance assessment: Independent                                         ADL either performed or assessed with clinical judgement   ADL Overall ADL's : Independent                                             Vision Baseline Vision/History: 0 No visual  deficits Ability to See in Adequate Light: 0 Adequate Patient Visual Report: No change from baseline       Perception         Praxis         Pertinent Vitals/Pain Pain Assessment Pain Assessment: 0-10 Pain Score: 6  Pain Location: arm Pain Descriptors / Indicators: Constant, Tingling Pain Intervention(s): Limited activity within patient's tolerance, Monitored during session     Extremity/Trunk Assessment Upper Extremity Assessment Upper Extremity Assessment: Right hand dominant;RUE deficits/detail;LUE deficits/detail RUE Deficits / Details: tingling in tip of pinky, otherwise fully intact LUE Deficits / Details: pt reports boxer's fracture in left hand about 1 month ago, reduced MCP flexion in 5th digit;otherwise LUE fully intact   Lower Extremity Assessment Lower Extremity Assessment: Overall WFL for tasks assessed   Cervical / Trunk Assessment Cervical / Trunk Assessment: Normal   Communication Communication Communication: No apparent difficulties   Cognition Arousal: Alert Behavior During Therapy: WFL for tasks assessed/performed Overall Cognitive Status: Within Functional Limits for tasks assessed  General Comments  vss on RA    Exercises     Shoulder Instructions      Home Living Family/patient expects to be discharged to:: Private residence Living Arrangements: Other relatives Available Help at Discharge: Family;Friend(s);Available PRN/intermittently Type of Home: House Home Access: Stairs to enter [employer] of Steps: 3 Entrance Stairs-Rails: Right;Left Home Layout: Two level Alternate Level Stairs-Number of Steps: flight Alternate Level Stairs-Rails: Left Bathroom Shower/Tub: Chief Strategy Officer: Standard     Home Equipment: Agricultural consultant (2 wheels);Wheelchair - Sport and exercise psychologist Comments: pt has 4 children      Prior Functioning/Environment  Prior Level of Function : Independent/Modified Independent               ADLs Comments: not working, not driving uses public transportation/family/friends        OT Problem List: Pain      OT Treatment/Interventions:      OT Goals(Current goals can be found in the care plan section) Acute Rehab OT Goals Patient Stated Goal: to go home OT Goal Formulation: With patient Time For Goal Achievement: 02/07/23 Potential to Achieve Goals: Good  OT Frequency:      Co-evaluation              AM-PAC OT "6 Clicks" Daily Activity     Outcome Measure Help from another person eating meals?: None Help from another person taking care of personal grooming?: None Help from another person toileting, which includes using toliet, bedpan, or urinal?: None Help from another person bathing (including washing, rinsing, drying)?: None Help from another person to put on and taking off regular upper body clothing?: None Help from another person to put on and taking off regular lower body clothing?: None 6 Click Score: 24   End of Session Nurse Communication: Mobility status  Activity Tolerance: Patient tolerated treatment well Patient left: in bed;with call bell/phone within reach  OT Visit Diagnosis: Other abnormalities of gait and mobility (R26.89)                Time: 6213-0865 OT Time Calculation (min): 24 min Charges:  OT General Charges $OT Visit: 1 Visit OT Evaluation $OT Eval Moderate Complexity: 1 Mod OT Treatments $Self Care/Home Management : 8-22 mins  Rosey Bath OTR/L Acute Rehabilitation Services Office: [phone]   Providence Crosby 01/24/2023, 2:02 PM

## 2023-01-24 NOTE — Progress Notes (Addendum)
  Progress Note    01/24/2023 6:31 AM 1 Day Post-Op  Subjective:  says his right hand feels much better.  Has just a little numbness in the tip of the pinky.  Says the ace wrap feels tight  Afebrile   Vitals:   01/24/23 0346 01/24/23 0605  BP: 115/75   Pulse: 65 (!) 43  Resp: (!) 22 (!) 23  Temp: (!) 97.5 F (36.4 C)   SpO2: 98% 98%    Physical Exam: General:  no distress Cardiac:  regular Lungs:  non labored Incisions:  right arm incision looks good.  Extremities:  easily palpable right radial pulse.  Forearm soft.  Motor and sensory are in tact right hand.   CBC    Component Value Date/Time   WBC 10.4 01/24/2023 0437   RBC 4.38 01/24/2023 0437   HGB 12.7 (L) 01/24/2023 0437   HCT 39.1 01/24/2023 0437   PLT 261 01/24/2023 0437   MCV 89.3 01/24/2023 0437   MCH 29.0 01/24/2023 0437   MCHC 32.5 01/24/2023 0437   RDW 13.7 01/24/2023 0437   LYMPHSABS 1.2 01/22/2023 1323   MONOABS 0.8 01/22/2023 1323   EOSABS 0.2 01/22/2023 1323   BASOSABS 0.0 01/22/2023 1323    BMET    Component Value Date/Time   NA 135 01/23/2023 0148   NA 139 08/10/2022 1029   K 3.2 (L) 01/23/2023 0148   CL 101 01/23/2023 0148   CO2 26 01/23/2023 0148   GLUCOSE 118 (H) 01/23/2023 0148   BUN 8 01/23/2023 0148   BUN 5 (L) 08/10/2022 1029   CREATININE 1.17 01/23/2023 0148   CALCIUM 8.5 (L) 01/23/2023 0148   GFRNONAA >60 01/23/2023 0148   GFRAA >60 05/04/2019 1452    INR    Component Value Date/Time   INR 1.1 01/22/2023 1323     Intake/Output Summary (Last 24 hours) at 01/24/2023 0631 Last data filed at 01/24/2023 0558 Gross per 24 hour  Intake 1000 ml  Output 1530 ml  Net -530 ml      Assessment/Plan:  32 y.o. male is s/p:  Right brachial artery, ulnar artery, radial artery, axillary artery embolectomy   1 Day Post-Op   -pt with easily palpable right radial pulse; motor and sensory in tact with minimal numbness in the tip of the 5th finger.  Incision looks  fine. -thrombus to path-pending -given embolus, needs 2D echo.   -DVT prophylaxis:  heparin gtt   Doreatha Massed, PA-C [facility] [phone] 01/24/2023 6:31 AM   VASCULAR STAFF ADDENDUM: I have independently interviewed and examined the patient. I agree with the above.  Pain controlled.  Feels much better than yesterday. Patient needs echo to assess for possible cardiac thrombus.  Patient also needs hypercoagulable workup which can be completed in the outpatient setting.  Recommend transitioning to DOAC until workup has been completed. The echo and hypercoagulable workup will drive future anticoagulation recommendations.  Will leave this up to outpatient hematology visit.  Okay for discharge from a vascular surgery perspective.  Victorino Sparrow MD [facility] Phone Number: [phone] 01/24/2023 8:53 AM

## 2023-01-24 NOTE — Progress Notes (Signed)
Culture, blood (Routine X 2) w Reflex to ID Panel     Status: None (Preliminary result)   Collection Time: 01/23/23 10:55 AM   Specimen: BLOOD RIGHT ARM  Result Value Ref Range Status   Specimen Description BLOOD RIGHT ARM  Final   Special Requests   Final    BOTTLES DRAWN AEROBIC AND ANAEROBIC Blood Culture adequate volume   Culture   Final    NO GROWTH < 24 HOURS Performed at [facility], [address], [place], [place] [address]    Report Status PENDING  Incomplete  Culture, blood (Routine X 2) w Reflex to ID Panel     Status: None (Preliminary result)   Collection Time: 01/23/23 10:55 AM   Specimen: BLOOD RIGHT HAND  Result Value Ref  Range Status   Specimen Description BLOOD RIGHT HAND  Final   Special Requests   Final    BOTTLES DRAWN AEROBIC AND ANAEROBIC Blood Culture adequate volume   Culture   Final    NO GROWTH < 24 HOURS Performed at [facility], [address] [place], [place] [address]    Report Status PENDING  Incomplete         Radiology Studies: ECHOCARDIOGRAM COMPLETE  Result Date: 01/24/2023    ECHOCARDIOGRAM REPORT   Patient Name:   Hayden Moore Date of Exam: 01/24/2023 Medical Rec #:  [id number]     Height:       71.5 in Accession #:    [id number]    Weight:       238.5 lb Date of Birth:  11-23-1990     BSA:          2.284 m Patient Age:    32 years      BP:           129/103 mmHg Patient Gender: M             HR:           55 bpm. Exam Location:  Inpatient Procedure: 3D Echo Indications:    Thrombus  History:        Patient has no prior history of Echocardiogram examinations.  Sonographer:    Harriette Bouillon Referring Phys: [phone] EMMA M COLLINS IMPRESSIONS  1. 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 Consider CMR for further evaulation. Left ventricular ejection fraction, by estimation, is 55 to 60%. The left ventricle has normal function. The left ventricle has no regional wall motion abnormalities. Left ventricular diastolic function could not be evaluated.  2. Right ventricular systolic function is normal. The right ventricular size is normal.  3. The mitral valve is normal in structure. Trivial mitral valve regurgitation.  4. The aortic valve is normal in structure. Aortic valve regurgitation is trivial. No aortic stenosis is present. 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 Consider CMR for further evaulation. Left ventricular ejection fraction, by estimation, is 55 to 60%. The left ventricle has normal function. The left ventricle has no regional wall motion abnormalities. The left ventricular internal cavity size was normal in  size. There is borderline left ventricular hypertrophy of the apical segment. Left ventricular diastolic function could not be evaluated. Right Ventricle: The right ventricular size is normal. No increase in right ventricular wall thickness. Right ventricular systolic function is normal. Left Atrium: Left atrial size was normal in size. Right Atrium: Right atrial size was normal in size. Pericardium: There is no evidence of pericardial effusion.  PROGRESS NOTE    Hayden Moore  [id number] DOB: [birth year] DOA: 01/22/2023 PCP: Georganna Skeans, MD    Brief Narrative:  32 year old with history of hypertension, smoker who presented to the hospital with 3 days of cool, painful and loss of sensation of right upper extremity, he woke up from sleep like that.  No previous history of thromboembolism.  No history of trauma.  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  Reported transient vision loss 3 months ago but improved now.  In the emergency room hemodynamically stable.  Loss of sensation medial right elbow, intact vasculature.  CT angiogram demonstrated thrombus within the axillary artery as well as the brachial bifurcation.  Started on heparin infusion.  Admitted with vascular surgery consultation. Patient does endorse 40 pound weight loss in 2 months.  Denies any night sweats or decreased appetite.  Still working actively. 9/30, right brachial artery, ulnar artery, radial artery and axillary artery embolectomy.  On heparin infusion. 8/1, cervical lymph node core biopsy.   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 Status post embolectomy as above. Hypercoagulable panels sent, results pending Surgery cleared for oral anticoagulation, after ultrasound-guided biopsy today will start on Eliquis. Echocardiogram with no thrombi, however it shows thickened myocardium.  Will discuss case with cardiology whether this indicates any infiltrative disease.  Thoracic and cervical lymphadenopathy: CT angiogram visualized cervical, hilar and mediastinal lymphadenopathy.  Heterogenous suspicious disease in the right lung.  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  Differential diagnosis includes sarcoid and malignancy CT scan of chest abdomen and pelvis is done that shows cervical, mediastinal and bilateral hilar lymphadenopathy, suspected lymphoma,  patchy focal airspace disease throughout the lungs?  Septic emboli.  No evidence of lymphadenopathy or metastatic disease in the abdomen or pelvis.  Blood cultures, no growth so far. COVID-negative ESR normal. CRP normal. ANA, C3/C4, rheumatoid factor, antiphospholipid antibody pending. Able to have lymph node biopsy of his cervical chain today. Echocardiogram shows thickened myocardium, hopefully lymph node biopsy will give answer.  Hypokalemia: Replaced.   DVT prophylaxis: Heparin infusion   Code Status: Full code Family Communication: None Disposition Plan: Status is: Inpatient Remains inpatient appropriate because: On heparin infusion, going for procedure     Consultants:  Vascular surgery Radiology  Procedures:  Embolectomy Cervical spine lymph node biopsy  Antimicrobials:  None   Subjective:  Patient seen in the morning rounds.  He had some tingling on his right pinky finger otherwise overall feels better.  Denies any complaints.  Compressive bandage was removed on my interview. After consenting with the patient, I discussed with IR for cervical node biopsy.  By the time this note is created, patient already underwent biopsy. Will monitor after procedure, start on Eliquis.    Objective: Vitals:   01/23/23 2313 01/24/23 0346 01/24/23 0605 01/24/23 0812  BP: (!) 133/92 115/75  (!) 129/103  Pulse: 63 65 (!) 43 67  Resp: 19 (!) 22 (!) 23 18  Temp: 97.8 F (36.6 C) (!) 97.5 F (36.4 C)  98.1 F (36.7 C)  TempSrc: Oral Oral  Oral  SpO2: 98% 98% 98% 100%  Weight:   108.2 kg   Height:        Intake/Output Summary (Last 24 hours) at 01/24/2023 1116 Last data filed at 01/24/2023 0558 Gross per 24 hour  Intake 1000 ml  Output 1530 ml  Net -530 ml   Filed Weights   01/23/23 0513 01/23/23 1133 01/24/23 2956  PROGRESS NOTE    Hayden Moore  [id number] DOB: [birth year] DOA: 01/22/2023 PCP: Georganna Skeans, MD    Brief Narrative:  32 year old with history of hypertension, smoker who presented to the hospital with 3 days of cool, painful and loss of sensation of right upper extremity, he woke up from sleep like that.  No previous history of thromboembolism.  No history of trauma.  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  Reported transient vision loss 3 months ago but improved now.  In the emergency room hemodynamically stable.  Loss of sensation medial right elbow, intact vasculature.  CT angiogram demonstrated thrombus within the axillary artery as well as the brachial bifurcation.  Started on heparin infusion.  Admitted with vascular surgery consultation. Patient does endorse 40 pound weight loss in 2 months.  Denies any night sweats or decreased appetite.  Still working actively. 9/30, right brachial artery, ulnar artery, radial artery and axillary artery embolectomy.  On heparin infusion. 8/1, cervical lymph node core biopsy.   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 Status post embolectomy as above. Hypercoagulable panels sent, results pending Surgery cleared for oral anticoagulation, after ultrasound-guided biopsy today will start on Eliquis. Echocardiogram with no thrombi, however it shows thickened myocardium.  Will discuss case with cardiology whether this indicates any infiltrative disease.  Thoracic and cervical lymphadenopathy: CT angiogram visualized cervical, hilar and mediastinal lymphadenopathy.  Heterogenous suspicious disease in the right lung.  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  Differential diagnosis includes sarcoid and malignancy CT scan of chest abdomen and pelvis is done that shows cervical, mediastinal and bilateral hilar lymphadenopathy, suspected lymphoma,  patchy focal airspace disease throughout the lungs?  Septic emboli.  No evidence of lymphadenopathy or metastatic disease in the abdomen or pelvis.  Blood cultures, no growth so far. COVID-negative ESR normal. CRP normal. ANA, C3/C4, rheumatoid factor, antiphospholipid antibody pending. Able to have lymph node biopsy of his cervical chain today. Echocardiogram shows thickened myocardium, hopefully lymph node biopsy will give answer.  Hypokalemia: Replaced.   DVT prophylaxis: Heparin infusion   Code Status: Full code Family Communication: None Disposition Plan: Status is: Inpatient Remains inpatient appropriate because: On heparin infusion, going for procedure     Consultants:  Vascular surgery Radiology  Procedures:  Embolectomy Cervical spine lymph node biopsy  Antimicrobials:  None   Subjective:  Patient seen in the morning rounds.  He had some tingling on his right pinky finger otherwise overall feels better.  Denies any complaints.  Compressive bandage was removed on my interview. After consenting with the patient, I discussed with IR for cervical node biopsy.  By the time this note is created, patient already underwent biopsy. Will monitor after procedure, start on Eliquis.    Objective: Vitals:   01/23/23 2313 01/24/23 0346 01/24/23 0605 01/24/23 0812  BP: (!) 133/92 115/75  (!) 129/103  Pulse: 63 65 (!) 43 67  Resp: 19 (!) 22 (!) 23 18  Temp: 97.8 F (36.6 C) (!) 97.5 F (36.4 C)  98.1 F (36.7 C)  TempSrc: Oral Oral  Oral  SpO2: 98% 98% 98% 100%  Weight:   108.2 kg   Height:        Intake/Output Summary (Last 24 hours) at 01/24/2023 1116 Last data filed at 01/24/2023 0558 Gross per 24 hour  Intake 1000 ml  Output 1530 ml  Net -530 ml   Filed Weights   01/23/23 0513 01/23/23 1133 01/24/23 2956  Culture, blood (Routine X 2) w Reflex to ID Panel     Status: None (Preliminary result)   Collection Time: 01/23/23 10:55 AM   Specimen: BLOOD RIGHT ARM  Result Value Ref Range Status   Specimen Description BLOOD RIGHT ARM  Final   Special Requests   Final    BOTTLES DRAWN AEROBIC AND ANAEROBIC Blood Culture adequate volume   Culture   Final    NO GROWTH < 24 HOURS Performed at [facility], [address], [place], [place] [address]    Report Status PENDING  Incomplete  Culture, blood (Routine X 2) w Reflex to ID Panel     Status: None (Preliminary result)   Collection Time: 01/23/23 10:55 AM   Specimen: BLOOD RIGHT HAND  Result Value Ref  Range Status   Specimen Description BLOOD RIGHT HAND  Final   Special Requests   Final    BOTTLES DRAWN AEROBIC AND ANAEROBIC Blood Culture adequate volume   Culture   Final    NO GROWTH < 24 HOURS Performed at [facility], [address] [place], [place] [address]    Report Status PENDING  Incomplete         Radiology Studies: ECHOCARDIOGRAM COMPLETE  Result Date: 01/24/2023    ECHOCARDIOGRAM REPORT   Patient Name:   Hayden Moore Date of Exam: 01/24/2023 Medical Rec #:  [id number]     Height:       71.5 in Accession #:    [id number]    Weight:       238.5 lb Date of Birth:  11-23-1990     BSA:          2.284 m Patient Age:    32 years      BP:           129/103 mmHg Patient Gender: M             HR:           55 bpm. Exam Location:  Inpatient Procedure: 3D Echo Indications:    Thrombus  History:        Patient has no prior history of Echocardiogram examinations.  Sonographer:    Harriette Bouillon Referring Phys: [phone] EMMA M COLLINS IMPRESSIONS  1. 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 Consider CMR for further evaulation. Left ventricular ejection fraction, by estimation, is 55 to 60%. The left ventricle has normal function. The left ventricle has no regional wall motion abnormalities. Left ventricular diastolic function could not be evaluated.  2. Right ventricular systolic function is normal. The right ventricular size is normal.  3. The mitral valve is normal in structure. Trivial mitral valve regurgitation.  4. The aortic valve is normal in structure. Aortic valve regurgitation is trivial. No aortic stenosis is present. 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 Consider CMR for further evaulation. Left ventricular ejection fraction, by estimation, is 55 to 60%. The left ventricle has normal function. The left ventricle has no regional wall motion abnormalities. The left ventricular internal cavity size was normal in  size. There is borderline left ventricular hypertrophy of the apical segment. Left ventricular diastolic function could not be evaluated. Right Ventricle: The right ventricular size is normal. No increase in right ventricular wall thickness. Right ventricular systolic function is normal. Left Atrium: Left atrial size was normal in size. Right Atrium: Right atrial size was normal in size. Pericardium: There is no evidence of pericardial effusion.  PROGRESS NOTE    Hayden Moore  [id number] DOB: [birth year] DOA: 01/22/2023 PCP: Georganna Skeans, MD    Brief Narrative:  32 year old with history of hypertension, smoker who presented to the hospital with 3 days of cool, painful and loss of sensation of right upper extremity, he woke up from sleep like that.  No previous history of thromboembolism.  No history of trauma.  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  Reported transient vision loss 3 months ago but improved now.  In the emergency room hemodynamically stable.  Loss of sensation medial right elbow, intact vasculature.  CT angiogram demonstrated thrombus within the axillary artery as well as the brachial bifurcation.  Started on heparin infusion.  Admitted with vascular surgery consultation. Patient does endorse 40 pound weight loss in 2 months.  Denies any night sweats or decreased appetite.  Still working actively. 9/30, right brachial artery, ulnar artery, radial artery and axillary artery embolectomy.  On heparin infusion. 8/1, cervical lymph node core biopsy.   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 Status post embolectomy as above. Hypercoagulable panels sent, results pending Surgery cleared for oral anticoagulation, after ultrasound-guided biopsy today will start on Eliquis. Echocardiogram with no thrombi, however it shows thickened myocardium.  Will discuss case with cardiology whether this indicates any infiltrative disease.  Thoracic and cervical lymphadenopathy: CT angiogram visualized cervical, hilar and mediastinal lymphadenopathy.  Heterogenous suspicious disease in the right lung.  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  Differential diagnosis includes sarcoid and malignancy CT scan of chest abdomen and pelvis is done that shows cervical, mediastinal and bilateral hilar lymphadenopathy, suspected lymphoma,  patchy focal airspace disease throughout the lungs?  Septic emboli.  No evidence of lymphadenopathy or metastatic disease in the abdomen or pelvis.  Blood cultures, no growth so far. COVID-negative ESR normal. CRP normal. ANA, C3/C4, rheumatoid factor, antiphospholipid antibody pending. Able to have lymph node biopsy of his cervical chain today. Echocardiogram shows thickened myocardium, hopefully lymph node biopsy will give answer.  Hypokalemia: Replaced.   DVT prophylaxis: Heparin infusion   Code Status: Full code Family Communication: None Disposition Plan: Status is: Inpatient Remains inpatient appropriate because: On heparin infusion, going for procedure     Consultants:  Vascular surgery Radiology  Procedures:  Embolectomy Cervical spine lymph node biopsy  Antimicrobials:  None   Subjective:  Patient seen in the morning rounds.  He had some tingling on his right pinky finger otherwise overall feels better.  Denies any complaints.  Compressive bandage was removed on my interview. After consenting with the patient, I discussed with IR for cervical node biopsy.  By the time this note is created, patient already underwent biopsy. Will monitor after procedure, start on Eliquis.    Objective: Vitals:   01/23/23 2313 01/24/23 0346 01/24/23 0605 01/24/23 0812  BP: (!) 133/92 115/75  (!) 129/103  Pulse: 63 65 (!) 43 67  Resp: 19 (!) 22 (!) 23 18  Temp: 97.8 F (36.6 C) (!) 97.5 F (36.4 C)  98.1 F (36.7 C)  TempSrc: Oral Oral  Oral  SpO2: 98% 98% 98% 100%  Weight:   108.2 kg   Height:        Intake/Output Summary (Last 24 hours) at 01/24/2023 1116 Last data filed at 01/24/2023 0558 Gross per 24 hour  Intake 1000 ml  Output 1530 ml  Net -530 ml   Filed Weights   01/23/23 0513 01/23/23 1133 01/24/23 2956  PROGRESS NOTE    Hayden Moore  [id number] DOB: [birth year] DOA: 01/22/2023 PCP: Georganna Skeans, MD    Brief Narrative:  32 year old with history of hypertension, smoker who presented to the hospital with 3 days of cool, painful and loss of sensation of right upper extremity, he woke up from sleep like that.  No previous history of thromboembolism.  No history of trauma.  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  Reported transient vision loss 3 months ago but improved now.  In the emergency room hemodynamically stable.  Loss of sensation medial right elbow, intact vasculature.  CT angiogram demonstrated thrombus within the axillary artery as well as the brachial bifurcation.  Started on heparin infusion.  Admitted with vascular surgery consultation. Patient does endorse 40 pound weight loss in 2 months.  Denies any night sweats or decreased appetite.  Still working actively. 9/30, right brachial artery, ulnar artery, radial artery and axillary artery embolectomy.  On heparin infusion. 8/1, cervical lymph node core biopsy.   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 Status post embolectomy as above. Hypercoagulable panels sent, results pending Surgery cleared for oral anticoagulation, after ultrasound-guided biopsy today will start on Eliquis. Echocardiogram with no thrombi, however it shows thickened myocardium.  Will discuss case with cardiology whether this indicates any infiltrative disease.  Thoracic and cervical lymphadenopathy: CT angiogram visualized cervical, hilar and mediastinal lymphadenopathy.  Heterogenous suspicious disease in the right lung.  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  Differential diagnosis includes sarcoid and malignancy CT scan of chest abdomen and pelvis is done that shows cervical, mediastinal and bilateral hilar lymphadenopathy, suspected lymphoma,  patchy focal airspace disease throughout the lungs?  Septic emboli.  No evidence of lymphadenopathy or metastatic disease in the abdomen or pelvis.  Blood cultures, no growth so far. COVID-negative ESR normal. CRP normal. ANA, C3/C4, rheumatoid factor, antiphospholipid antibody pending. Able to have lymph node biopsy of his cervical chain today. Echocardiogram shows thickened myocardium, hopefully lymph node biopsy will give answer.  Hypokalemia: Replaced.   DVT prophylaxis: Heparin infusion   Code Status: Full code Family Communication: None Disposition Plan: Status is: Inpatient Remains inpatient appropriate because: On heparin infusion, going for procedure     Consultants:  Vascular surgery Radiology  Procedures:  Embolectomy Cervical spine lymph node biopsy  Antimicrobials:  None   Subjective:  Patient seen in the morning rounds.  He had some tingling on his right pinky finger otherwise overall feels better.  Denies any complaints.  Compressive bandage was removed on my interview. After consenting with the patient, I discussed with IR for cervical node biopsy.  By the time this note is created, patient already underwent biopsy. Will monitor after procedure, start on Eliquis.    Objective: Vitals:   01/23/23 2313 01/24/23 0346 01/24/23 0605 01/24/23 0812  BP: (!) 133/92 115/75  (!) 129/103  Pulse: 63 65 (!) 43 67  Resp: 19 (!) 22 (!) 23 18  Temp: 97.8 F (36.6 C) (!) 97.5 F (36.4 C)  98.1 F (36.7 C)  TempSrc: Oral Oral  Oral  SpO2: 98% 98% 98% 100%  Weight:   108.2 kg   Height:        Intake/Output Summary (Last 24 hours) at 01/24/2023 1116 Last data filed at 01/24/2023 0558 Gross per 24 hour  Intake 1000 ml  Output 1530 ml  Net -530 ml   Filed Weights   01/23/23 0513 01/23/23 1133 01/24/23 2956

## 2023-01-24 NOTE — Progress Notes (Signed)
Pt came back to rm 1 from IR. Restarted heparin iv drip per IR advised.  Lawson Radar, RN

## 2023-01-24 NOTE — Consult Note (Signed)
Chief Complaint: Patient was seen in consultation today for LN bx Chief Complaint  Patient presents with   Hand Pain   at the request of Dorcas Carrow  Referring Physician(s): Dorcas Carrow  Supervising Physician: Richarda Overlie  Patient Status: [facility] - In-pt  History of Present Illness: Hayden Moore is a 32 y.o. 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 CTA of the right arm on 9/29 showed incidental finding of  right hilar, mediastinal, and cervical lymphadenopathy, concerning for malignancy, CT C/A/P with on 9/30 showed:   1. Mediastinal and bilateral hilar lymphadenopathy. This is likely lymphoma although metastatic disease could also have this appearance. 2. Patchy focal airspace disease throughout both lungs. This could represent ground-glass metastatic disease, multifocal pneumonia, or septic emboli. 3. No evidence of lymphadenopathy or metastatic disease in the abdomen or pelvis.  IR was requested for image guided LN bx, case reviewed and approve for US guided right supraclavicular LN bx by dr. Lowella Dandy.   Patient laying in bed, not in acute distress. Has hiccup. States that his right arm feels much better after the sx.  Denise headache, fever, chills, shortness of breath, cough, chest pain, abdominal pain, nausea ,vomiting, and bleeding.  Patient was informed that bx will not be performed if the target move too much (increase change of damaging adjacent structures) with his hiccup, we will regroup on different day. He verbalized understanding.    Past Medical History:  Diagnosis Date    Arthritis    GERD (gastroesophageal reflux disease)    Hypertension     Past Surgical History:  Procedure Laterality Date   EMBOLECTOMY Right 01/23/2023   Procedure: ARM BRACHIAL EMBOLECTOMY;  Surgeon: Victorino Sparrow, MD;  Location: [facility] OR;  Service: Vascular;  Laterality: Right;    Allergies: Patient has no known allergies.  Medications: Prior to Admission medications   Medication Sig Start Date End Date Taking? Authorizing Provider  lisinopril (ZESTRIL) 10 MG tablet TAKE 1 TABLET BY MOUTH EVERY DAY 10/31/22  Yes Zonia Kief, Amy J, NP  meloxicam (MOBIC) 15 MG tablet Take 1 tablet (15 mg total) by mouth daily. 11/03/22  Yes Burnette, Alessandra Bevels, PA-C     Family History  Family history unknown: Yes    Social History   Socioeconomic History   Marital status: Single    Spouse name: Not on file   Number of children: Not on file   Years of education: Not on file   Highest education level: Not on file  Occupational History   Not on file  Tobacco Use   Smoking status: Every Day    Current packs/day: 0.50    Types: Cigarettes   Smokeless tobacco: Never  Vaping Use   Vaping status: Never Used  Substance and Sexual Activity   Alcohol use: Yes   Drug use: No   Sexual activity: Not on file  Other Topics Concern   Not on file  Social History Narrative   Not on file   Social Determinants of Health   Financial Resource Strain: Not on file  Food Insecurity: No Food Insecurity (01/23/2023)   Hunger Vital Sign    Worried  About Running Out of Food in the Last Year: Never true    Ran Out of Food in the Last Year: Never true  Transportation Needs: No Transportation Needs (01/23/2023)   PRAPARE - Administrator, Civil Service (Medical): No    Lack of Transportation (Non-Medical): No  Physical Activity: Not on file  Stress: Not on file  Social Connections: Not on file     Review of Systems: A 12 point ROS discussed and pertinent positives are indicated in the HPI  above.  All other systems are negative.  Vital Signs: BP (!) 129/103 (BP Location: Left Arm)   Pulse 67   Temp 98.1 F (36.7 C) (Oral)   Resp 18   Ht 5' 11.5" (1.816 m)   Wt 238 lb 8 oz (108.2 kg)   SpO2 100%   BMI 32.80 kg/m    Physical Exam Vitals and nursing note reviewed.  Constitutional:      General: Patient is not in acute distress.    Appearance: Normal appearance. Patient is not ill-appearing.  HENT:     Head: Normocephalic and atraumatic.     Mouth/Throat:     Mouth: Mucous membranes are moist.     Pharynx: Oropharynx is clear.  Cardiovascular:     Rate and Rhythm: Normal rate and regular rhythm.     Pulses: Normal pulses.     Heart sounds: Normal heart sounds.  Pulmonary:     Effort: Pulmonary effort is normal.     Breath sounds: Normal breath sounds.  Abdominal:     General: Abdomen is flat. Bowel sounds are normal.     Palpations: Abdomen is soft.  Musculoskeletal:     Cervical back: Neck supple.  Skin:    General: Skin is warm and dry.     Coloration: Skin is not jaundiced or pale.  Neurological:     Mental Status: Patient is alert and oriented to person, place, and time.  Psychiatric:        Mood and Affect: Mood normal.        Behavior: Behavior normal.        Judgment: Judgment normal.    MD Evaluation Airway: WNL Heart: WNL Abdomen: WNL Chest/ Lungs: WNL ASA  Classification: 3 Mallampati/Airway Score: Two  Imaging: ECHOCARDIOGRAM COMPLETE  Result Date: 01/24/2023    ECHOCARDIOGRAM REPORT   Patient Name:   [facility] Date of Exam: 01/24/2023 Medical Rec #:  [id number]     Height:       71.5 in Accession #:    [id number]    Weight:       238.5 lb Date of Birth:  1990-10-27     BSA:          2.284 m Patient Age:    32 years      BP:           129/103 mmHg Patient Gender: M             HR:           55 bpm. Exam Location:  Inpatient Procedure: 3D Echo Indications:    Thrombus  History:        Patient has no prior history of Echocardiogram  examinations.  Sonographer:    Harriette Bouillon Referring Phys: [phone] EMMA M COLLINS IMPRESSIONS  1. 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 Consider CMR for further evaulation. Left ventricular ejection fraction, by estimation, is 55 to 60%. The left ventricle has normal  function. The left ventricle has no regional wall motion abnormalities. Left ventricular diastolic function could not be evaluated.  2. Right ventricular systolic function is normal. The right ventricular size is normal.  3. The mitral valve is normal in structure. Trivial mitral valve regurgitation.  4. The aortic valve is normal in structure. Aortic valve regurgitation is trivial. No aortic stenosis is present. 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 Consider CMR for further evaulation. Left ventricular ejection fraction, by estimation, is 55 to 60%. The left ventricle has normal function. The left ventricle has no regional wall motion abnormalities. The left ventricular internal cavity size was normal in size. There is borderline left ventricular hypertrophy of the apical segment. Left ventricular diastolic function could not be evaluated. Right Ventricle: The right ventricular size is normal. No increase in right ventricular wall thickness. Right ventricular systolic function is normal. Left Atrium: Left atrial size was normal in size. Right Atrium: Right atrial size was normal in size. Pericardium: There is no evidence of pericardial effusion. Mitral Valve: The mitral valve is normal in structure. Trivial mitral valve regurgitation. Tricuspid Valve: The tricuspid valve is grossly normal. Tricuspid valve regurgitation is trivial. Aortic Valve: The aortic valve is normal in structure. Aortic valve regurgitation is trivial. No aortic stenosis is present. Pulmonic Valve: The pulmonic valve was normal in structure. Pulmonic valve regurgitation is not visualized. Aorta: The aortic  root and ascending aorta are structurally normal, with no evidence of dilitation. IAS/Shunts: The interatrial septum was not assessed.  LEFT VENTRICLE PLAX 2D LVIDd:         6.10 cm   Diastology LVIDs:         4.40 cm   LV e' medial:    8.49 cm/s LV PW:         0.80 cm   LV E/e' medial:  10.7 LV IVS:        0.80 cm   LV e' lateral:   14.00 cm/s LVOT diam:     2.20 cm   LV E/e' lateral: 6.5 LV SV:         82 LV SV Index:   36 LVOT Area:     3.80 cm  RIGHT VENTRICLE            IVC RV S prime:     8.85 cm/s  IVC diam: 2.90 cm TAPSE (M-mode): 3.0 cm LEFT ATRIUM           Index        RIGHT ATRIUM           Index LA diam:      4.20 cm 1.84 cm/m   RA Area:     19.40 cm LA Vol (A4C): 62.0 ml 27.14 ml/m  RA Volume:   54.30 ml  23.77 ml/m  AORTIC VALVE LVOT Vmax:   93.00 cm/s LVOT Vmean:  63.200 cm/s LVOT VTI:    0.217 m  AORTA Ao Root diam: 3.10 cm Ao Asc diam:  3.20 cm MITRAL VALVE MV Area (PHT): 3.48 cm    SHUNTS MV Decel Time: 218 msec    Systemic VTI:  0.22 m MV E velocity: 90.70 cm/s  Systemic Diam: 2.20 cm MV A velocity: 57.50 cm/s MV E/A ratio:  1.58 Clearnce Hasten Electronically signed by Clearnce Hasten Signature Date/Time: 01/24/2023/9:20:58 AM    Final    PERIPHERAL VASCULAR CATHETERIZATION  Result Date: 01/23/2023 See surgical note for result.  CT CHEST ABDOMEN PELVIS W  CONTRAST  Result Date: 01/23/2023 CLINICAL DATA:  Lymphadenopathy. Right upper extremity arterial thrombus. Numbness and tingling to the right hand for 2 days. No trauma. EXAM: CT CHEST, ABDOMEN, AND PELVIS WITH CONTRAST TECHNIQUE: Multidetector CT imaging of the chest, abdomen and pelvis was performed following the standard protocol during bolus administration of intravenous contrast. RADIATION DOSE REDUCTION: This exam was performed according to the departmental dose-optimization program which includes automated exposure control, adjustment of the mA and/or kV according to patient size and/or use of iterative reconstruction  technique. CONTRAST:  75mL OMNIPAQUE IOHEXOL 350 MG/ML SOLN COMPARISON:  CT abdomen and pelvis 10/08/2020 FINDINGS: CT CHEST FINDINGS Cardiovascular: Normal heart size. No pericardial effusions. Normal caliber thoracic aorta. Mediastinum/Nodes: Esophagus is decompressed. Prominent mediastinal lymphadenopathy with right paratracheal nodes measuring up to 2.1 cm short axis dimension and subcarinal nodes measuring up to 2.3 cm short axis dimension. Bilateral hilar lymphadenopathy. Thyroid gland is unremarkable. Lungs/Pleura: Multiple focal areas of patchy infiltration in the lungs with somewhat nodular pattern and with mostly peribronchial/perivascular distribution. No pleural effusions. No pneumothorax. Musculoskeletal: No focal bone lesions. CT ABDOMEN PELVIS FINDINGS Hepatobiliary: No focal liver abnormality is seen. No gallstones, gallbladder wall thickening, or biliary dilatation. Pancreas: Unremarkable. No pancreatic ductal dilatation or surrounding inflammatory changes. Spleen: Normal in size without focal abnormality. Adrenals/Urinary Tract: Adrenal glands are unremarkable. Kidneys are normal, without renal calculi, focal lesion, or hydronephrosis. Bladder is unremarkable. Stomach/Bowel: Stomach is within normal limits. Appendix appears normal. No evidence of bowel wall thickening, distention, or inflammatory changes. Vascular/Lymphatic: No significant lymphadenopathy in the abdomen or pelvis. Normal caliber abdominal aorta. Reproductive: Prostate is unremarkable. Other: No abdominal wall hernia or abnormality. No abdominopelvic ascites. Musculoskeletal: No focal bone lesions. IMPRESSION: 1. Mediastinal and bilateral hilar lymphadenopathy. This is likely lymphoma although metastatic disease could also have this appearance. 2. Patchy focal airspace disease throughout both lungs. This could represent ground-glass metastatic disease, multifocal pneumonia, or septic emboli. 3. No evidence of lymphadenopathy or  metastatic disease in the abdomen or pelvis. Electronically Signed   By: Burman Nieves M.D.   On: 01/23/2023 00:26   CT ANGIO UP EXTREM RIGHT W &/OR WO CONTRAST  Addendum Date: 01/22/2023   ADDENDUM REPORT: 01/22/2023 16:38 ADDENDUM: Findings reported by telephone to Dr. Wilkie Aye, 4:35 p.m. Electronically Signed   By: Jearld Lesch M.D.   On: 01/22/2023 16:38   Result Date: 01/22/2023 CLINICAL DATA:  Diminished pulses, claudication * Tracking Code: BO * EXAM: CT ANGIOGRAPHY UPPER RIGHT EXTREMITY TECHNIQUE: CT angiogram of the right upper extremity was obtained in the arterial phase of contrast administration. CONTRAST:  OMNIPAQUE IOHEXOL 350 MG/ML SOLN COMPARISON:  None Available. FINDINGS: Vascular: Normal contour and caliber of the right upper extremity vasculature. No significant atherosclerosis. 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 The vessel remains occluded through the division of the radial, ulnar, and interosseous arteries, with reconstitution of the branch vessels below the elbow (series 12, image 27). Branch vessels remain patent to the wrist. Vessels of the hand and palmar arch are not well appreciated, probably due to early phase of contrast. Osseous and musculoskeletal: No fracture or dislocation. Soft tissues unremarkable. Partially included chest: Right hilar and mediastinal lymphadenopathy (series 18, image 78). Lower cervical and supraclavicular lymphadenopathy. Heterogeneous airspace disease throughout the partially imaged  right lung. Review of the MIP images confirms the above findings. IMPRESSION: 1. 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 2. Occlusion of the distal right brachial artery  at the level of the inferior ulnar collateral artery, just above the level of the elbow. The vessel remains occluded through the division of the radial, ulnar, and interosseous arteries, with reconstitution of the branch vessels below the elbow. 3. Branch vessels of the right forearm remain patent to the wrist. Vessels of the hand and palmar arch are not well appreciated, probably due to early phase of contrast. 4. Right hilar, mediastinal, and cervical lymphadenopathy, concerning for malignancy. 5. Heterogeneous airspace disease throughout the partially imaged right lung. Consider dedicated imaging of the chest. Call report request was placed at the time of interpretation. Report issued at this time in the interest of expedient communication. Final communication of critical findings will be documented. Electronically Signed: By: Jearld Lesch M.D. On: 01/22/2023 16:31    Labs:  CBC: Recent Labs    01/22/23 1323 01/23/23 0148 01/24/23 0437  WBC 5.6 6.7 10.4  HGB 13.6 12.6* 12.7*  HCT 40.8 38.6* 39.1  PLT 287 260 261    COAGS: Recent Labs    01/22/23 1323  INR 1.1    BMP: Recent Labs    08/10/22 1029 01/22/23 1323 01/23/23 0148  NA 139 138 135  K 4.1 3.5 3.2*  CL 104 103 101  CO2 20 24 26   GLUCOSE 92 98 118*  BUN 5* 10 8  CALCIUM 9.1 8.9 8.5*  CREATININE 0.79 1.16 1.17  GFRNONAA  --  >60 >60    LIVER FUNCTION TESTS: Recent Labs    08/10/22 1029 01/22/23 1323  BILITOT 0.5 1.0  AST 59* 53*  ALT 61* 48*  ALKPHOS 96 93  PROT 7.3 6.7  ALBUMIN 4.3 3.1*    TUMOR MARKERS: No results for input(s): "AFPTM", "CEA", "CA199", "CHROMGRNA" in the last 8760 hours.  Assessment and Plan: 32 y.o. male with LAN concerning for metastatic disease who presents for right supraclavicular LN bx.   NPO since MN, small sip of water with medicine this morning  CBC stable hgb 12.7, plt 261  On hepatin drip, Dr. Lowella Dandy aware, has been held per his request  Allergies reviewed   Risks and  benefits of supraclavicular LN bx was discussed with the patient and/or patient's family including, but not limited to bleeding, infection, damage to adjacent structures or low yield requiring additional tests.  All of the questions were answered and there is agreement to proceed.  Consent signed and in chart.   Thank you for this interesting consult.  I greatly enjoyed meeting Fernandez Weitzel and look forward to participating in their care.  A copy of this report was sent to the requesting provider on this date.  Electronically Signed: Willette Brace, PA-C 01/24/2023, 9:29 AM   I spent a total of 20 Minutes    in face to face in clinical consultation, greater than 50% of which was counseling/coordinating care for right supraclavicular LN bx.   This chart was dictated using voice recognition software.  Despite best efforts to proofread,  errors can occur which can change the documentation meaning.

## 2023-01-24 NOTE — Consult Note (Signed)
cervical and chest lymphadenopathy. EXAM: ULTRASOUND-GUIDED CORE BIOPSY OF LEFT CERVICAL LYMPH NODES MEDICATIONS: 1% lidocaine for local anesthetic ANESTHESIA/SEDATION: None FLUOROSCOPY TIME:  None COMPLICATIONS: None immediate. PROCEDURE: Informed written consent was obtained from the patient after a thorough discussion of the procedural risks, benefits and alternatives. All questions were addressed. A timeout was performed prior to the initiation of the procedure. Both sides of the neck was evaluated with ultrasound. Small irregular lymph nodes identified bilaterally. Group of small irregular lymph nodes on the left side of the neck were targeted for biopsy. Left side of the neck was prepped and draped in sterile fashion. Skin was anesthetized using 1% lidocaine. Small incision was made. Using ultrasound guidance, 18 gauge core device was directed into the group of lymph nodes. Total of 5 core biopsies were obtained. Specimens placed on a Telfa pad with saline. Bandage placed over the puncture site. FINDINGS: Small irregular lymph nodes on both sides of the neck. Small group of adjacent lymph nodes in the left supraclavicular area were biopsied. Biopsy needle confirmed within the lymph nodes. IMPRESSION: Ultrasound-guided core biopsy of left cervical lymph nodes. Electronically Signed   By: Richarda Overlie M.D.   On: 01/24/2023 11:19   ECHOCARDIOGRAM COMPLETE  Result Date: 01/24/2023    ECHOCARDIOGRAM REPORT   Patient Name:   Hayden Moore Date of Exam: 01/24/2023 Medical Rec #:  [id number]     Height:       71.5 in Accession #:    [id number]    Weight:       238.5 lb Date of Birth:  [birth year]     BSA:          2.284 m Patient Age:    32 years      BP:           129/103 mmHg Patient Gender: M             HR:           55 bpm. Exam Location:  Inpatient Procedure:  3D Echo Indications:    Thrombus  History:        Patient has no prior history of Echocardiogram examinations.  Sonographer:    Harriette Bouillon Referring Phys: [phone] EMMA M COLLINS IMPRESSIONS  1. 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 Consider CMR for further evaulation. Left ventricular ejection fraction, by estimation, is 55 to 60%. The left ventricle has normal function. The left ventricle has no regional wall motion abnormalities. Left ventricular diastolic function could not be evaluated.  2. Right ventricular systolic function is normal. The right ventricular size is normal.  3. The mitral valve is normal in structure. Trivial mitral valve regurgitation.  4. The aortic valve is normal in structure. Aortic valve regurgitation is trivial. No aortic stenosis is present. 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 Consider CMR for further evaulation. Left ventricular ejection fraction, by estimation, is 55 to 60%. The left ventricle has normal function. The left ventricle has no regional wall motion abnormalities. The left ventricular internal cavity size was normal in size. There is borderline left ventricular hypertrophy of the apical segment. Left ventricular diastolic function could not be evaluated. Right Ventricle: The right ventricular size is normal. No increase in right ventricular wall thickness. Right ventricular systolic function is normal. Left Atrium: Left atrial size was normal in size. Right Atrium: Right atrial size was normal in size. Pericardium: There is no evidence of pericardial effusion. Mitral Valve: The  cervical and chest lymphadenopathy. EXAM: ULTRASOUND-GUIDED CORE BIOPSY OF LEFT CERVICAL LYMPH NODES MEDICATIONS: 1% lidocaine for local anesthetic ANESTHESIA/SEDATION: None FLUOROSCOPY TIME:  None COMPLICATIONS: None immediate. PROCEDURE: Informed written consent was obtained from the patient after a thorough discussion of the procedural risks, benefits and alternatives. All questions were addressed. A timeout was performed prior to the initiation of the procedure. Both sides of the neck was evaluated with ultrasound. Small irregular lymph nodes identified bilaterally. Group of small irregular lymph nodes on the left side of the neck were targeted for biopsy. Left side of the neck was prepped and draped in sterile fashion. Skin was anesthetized using 1% lidocaine. Small incision was made. Using ultrasound guidance, 18 gauge core device was directed into the group of lymph nodes. Total of 5 core biopsies were obtained. Specimens placed on a Telfa pad with saline. Bandage placed over the puncture site. FINDINGS: Small irregular lymph nodes on both sides of the neck. Small group of adjacent lymph nodes in the left supraclavicular area were biopsied. Biopsy needle confirmed within the lymph nodes. IMPRESSION: Ultrasound-guided core biopsy of left cervical lymph nodes. Electronically Signed   By: Richarda Overlie M.D.   On: 01/24/2023 11:19   ECHOCARDIOGRAM COMPLETE  Result Date: 01/24/2023    ECHOCARDIOGRAM REPORT   Patient Name:   Hayden Moore Date of Exam: 01/24/2023 Medical Rec #:  [id number]     Height:       71.5 in Accession #:    [id number]    Weight:       238.5 lb Date of Birth:  [birth year]     BSA:          2.284 m Patient Age:    32 years      BP:           129/103 mmHg Patient Gender: M             HR:           55 bpm. Exam Location:  Inpatient Procedure:  3D Echo Indications:    Thrombus  History:        Patient has no prior history of Echocardiogram examinations.  Sonographer:    Harriette Bouillon Referring Phys: [phone] EMMA M COLLINS IMPRESSIONS  1. 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 Consider CMR for further evaulation. Left ventricular ejection fraction, by estimation, is 55 to 60%. The left ventricle has normal function. The left ventricle has no regional wall motion abnormalities. Left ventricular diastolic function could not be evaluated.  2. Right ventricular systolic function is normal. The right ventricular size is normal.  3. The mitral valve is normal in structure. Trivial mitral valve regurgitation.  4. The aortic valve is normal in structure. Aortic valve regurgitation is trivial. No aortic stenosis is present. 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 Consider CMR for further evaulation. Left ventricular ejection fraction, by estimation, is 55 to 60%. The left ventricle has normal function. The left ventricle has no regional wall motion abnormalities. The left ventricular internal cavity size was normal in size. There is borderline left ventricular hypertrophy of the apical segment. Left ventricular diastolic function could not be evaluated. Right Ventricle: The right ventricular size is normal. No increase in right ventricular wall thickness. Right ventricular systolic function is normal. Left Atrium: Left atrial size was normal in size. Right Atrium: Right atrial size was normal in size. Pericardium: There is no evidence of pericardial effusion. Mitral Valve: The  cervical and chest lymphadenopathy. EXAM: ULTRASOUND-GUIDED CORE BIOPSY OF LEFT CERVICAL LYMPH NODES MEDICATIONS: 1% lidocaine for local anesthetic ANESTHESIA/SEDATION: None FLUOROSCOPY TIME:  None COMPLICATIONS: None immediate. PROCEDURE: Informed written consent was obtained from the patient after a thorough discussion of the procedural risks, benefits and alternatives. All questions were addressed. A timeout was performed prior to the initiation of the procedure. Both sides of the neck was evaluated with ultrasound. Small irregular lymph nodes identified bilaterally. Group of small irregular lymph nodes on the left side of the neck were targeted for biopsy. Left side of the neck was prepped and draped in sterile fashion. Skin was anesthetized using 1% lidocaine. Small incision was made. Using ultrasound guidance, 18 gauge core device was directed into the group of lymph nodes. Total of 5 core biopsies were obtained. Specimens placed on a Telfa pad with saline. Bandage placed over the puncture site. FINDINGS: Small irregular lymph nodes on both sides of the neck. Small group of adjacent lymph nodes in the left supraclavicular area were biopsied. Biopsy needle confirmed within the lymph nodes. IMPRESSION: Ultrasound-guided core biopsy of left cervical lymph nodes. Electronically Signed   By: Richarda Overlie M.D.   On: 01/24/2023 11:19   ECHOCARDIOGRAM COMPLETE  Result Date: 01/24/2023    ECHOCARDIOGRAM REPORT   Patient Name:   Hayden Moore Date of Exam: 01/24/2023 Medical Rec #:  [id number]     Height:       71.5 in Accession #:    [id number]    Weight:       238.5 lb Date of Birth:  [birth year]     BSA:          2.284 m Patient Age:    32 years      BP:           129/103 mmHg Patient Gender: M             HR:           55 bpm. Exam Location:  Inpatient Procedure:  3D Echo Indications:    Thrombus  History:        Patient has no prior history of Echocardiogram examinations.  Sonographer:    Harriette Bouillon Referring Phys: [phone] EMMA M COLLINS IMPRESSIONS  1. 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 Consider CMR for further evaulation. Left ventricular ejection fraction, by estimation, is 55 to 60%. The left ventricle has normal function. The left ventricle has no regional wall motion abnormalities. Left ventricular diastolic function could not be evaluated.  2. Right ventricular systolic function is normal. The right ventricular size is normal.  3. The mitral valve is normal in structure. Trivial mitral valve regurgitation.  4. The aortic valve is normal in structure. Aortic valve regurgitation is trivial. No aortic stenosis is present. 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 Consider CMR for further evaulation. Left ventricular ejection fraction, by estimation, is 55 to 60%. The left ventricle has normal function. The left ventricle has no regional wall motion abnormalities. The left ventricular internal cavity size was normal in size. There is borderline left ventricular hypertrophy of the apical segment. Left ventricular diastolic function could not be evaluated. Right Ventricle: The right ventricular size is normal. No increase in right ventricular wall thickness. Right ventricular systolic function is normal. Left Atrium: Left atrial size was normal in size. Right Atrium: Right atrial size was normal in size. Pericardium: There is no evidence of pericardial effusion. 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 Cross-sectional imaging demonstrates cervical and chest lymphadenopathy. EXAM: ULTRASOUND-GUIDED CORE BIOPSY OF LEFT CERVICAL LYMPH NODES MEDICATIONS: 1% lidocaine for local anesthetic ANESTHESIA/SEDATION: None FLUOROSCOPY TIME:  None COMPLICATIONS: None immediate. PROCEDURE: Informed written consent was obtained from the patient after a thorough discussion of the procedural risks, benefits and alternatives. All questions were addressed. A timeout was performed prior to the initiation of the procedure. Both sides of the neck was evaluated with ultrasound. Small irregular lymph nodes identified bilaterally. Group of small irregular lymph nodes on the left side of the neck were targeted for biopsy. Left side of the neck was prepped and draped in sterile fashion. Skin was anesthetized using 1%  lidocaine. Small incision was made. Using ultrasound guidance, 18 gauge core device was directed into the group of lymph nodes. Total of 5 core biopsies were obtained. Specimens placed on a Telfa pad with saline. Bandage placed over the puncture site. FINDINGS: Small irregular lymph nodes on both sides of the neck. Small group of adjacent lymph nodes in the left supraclavicular area were biopsied. Biopsy needle confirmed within the lymph nodes. IMPRESSION: Ultrasound-guided core biopsy of left cervical lymph nodes. Electronically Signed   By: Richarda Overlie M.D.   On: 01/24/2023 11:19   ECHOCARDIOGRAM COMPLETE  Result Date: 01/24/2023    ECHOCARDIOGRAM REPORT   Patient Name:   Hayden Moore Date of Exam: 01/24/2023 Medical Rec #:  [id number]     Height:       71.5 in Accession #:    [id number]    Weight:       238.5 lb Date of Birth:  Mar 19, 1991     BSA:          2.284 m Patient Age:    32 years      BP:           129/103 mmHg Patient Gender: M             HR:           55 bpm. Exam Location:  Inpatient Procedure: 3D Echo Indications:    Thrombus  History:        Patient has no prior history of Echocardiogram examinations.  Sonographer:    Harriette Bouillon Referring Phys: [phone] EMMA M COLLINS IMPRESSIONS  1. 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 Consider CMR for further evaulation. Left ventricular ejection fraction, by estimation, is 55 to 60%. The left ventricle has normal function. The left ventricle has no regional wall motion abnormalities. Left ventricular diastolic function could not be evaluated.  2. Right ventricular systolic function is normal. The right ventricular size is normal.  3. The mitral valve is normal in structure. Trivial mitral valve regurgitation.  4. The aortic valve is normal in structure. Aortic valve regurgitation is trivial. No aortic stenosis is present. 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 Consider CMR for further  evaulation. Left ventricular ejection fraction, by estimation, is 55 to 60%. The left ventricle has normal function. The left ventricle has no regional wall motion abnormalities. The left ventricular internal cavity size was normal in size. There is borderline left ventricular hypertrophy of the apical segment. Left ventricular diastolic function could not be evaluated. Right Ventricle: The right ventricular size is normal. No increase in right ventricular wall thickness. Right ventricular systolic function is normal. Left Atrium: Left atrial size was normal in size. 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 Cross-sectional imaging demonstrates cervical and chest lymphadenopathy. EXAM: ULTRASOUND-GUIDED CORE BIOPSY OF LEFT CERVICAL LYMPH NODES MEDICATIONS: 1% lidocaine for local anesthetic ANESTHESIA/SEDATION: None FLUOROSCOPY TIME:  None COMPLICATIONS: None immediate. PROCEDURE: Informed written consent was obtained from the patient after a thorough discussion of the procedural risks, benefits and alternatives. All questions were addressed. A timeout was performed prior to the initiation of the procedure. Both sides of the neck was evaluated with ultrasound. Small irregular lymph nodes identified bilaterally. Group of small irregular lymph nodes on the left side of the neck were targeted for biopsy. Left side of the neck was prepped and draped in sterile fashion. Skin was anesthetized using 1%  lidocaine. Small incision was made. Using ultrasound guidance, 18 gauge core device was directed into the group of lymph nodes. Total of 5 core biopsies were obtained. Specimens placed on a Telfa pad with saline. Bandage placed over the puncture site. FINDINGS: Small irregular lymph nodes on both sides of the neck. Small group of adjacent lymph nodes in the left supraclavicular area were biopsied. Biopsy needle confirmed within the lymph nodes. IMPRESSION: Ultrasound-guided core biopsy of left cervical lymph nodes. Electronically Signed   By: Richarda Overlie M.D.   On: 01/24/2023 11:19   ECHOCARDIOGRAM COMPLETE  Result Date: 01/24/2023    ECHOCARDIOGRAM REPORT   Patient Name:   Hayden Moore Date of Exam: 01/24/2023 Medical Rec #:  [id number]     Height:       71.5 in Accession #:    [id number]    Weight:       238.5 lb Date of Birth:  Mar 19, 1991     BSA:          2.284 m Patient Age:    32 years      BP:           129/103 mmHg Patient Gender: M             HR:           55 bpm. Exam Location:  Inpatient Procedure: 3D Echo Indications:    Thrombus  History:        Patient has no prior history of Echocardiogram examinations.  Sonographer:    Harriette Bouillon Referring Phys: [phone] EMMA M COLLINS IMPRESSIONS  1. 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 Consider CMR for further evaulation. Left ventricular ejection fraction, by estimation, is 55 to 60%. The left ventricle has normal function. The left ventricle has no regional wall motion abnormalities. Left ventricular diastolic function could not be evaluated.  2. Right ventricular systolic function is normal. The right ventricular size is normal.  3. The mitral valve is normal in structure. Trivial mitral valve regurgitation.  4. The aortic valve is normal in structure. Aortic valve regurgitation is trivial. No aortic stenosis is present. 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 Consider CMR for further  evaulation. Left ventricular ejection fraction, by estimation, is 55 to 60%. The left ventricle has normal function. The left ventricle has no regional wall motion abnormalities. The left ventricular internal cavity size was normal in size. There is borderline left ventricular hypertrophy of the apical segment. Left ventricular diastolic function could not be evaluated. Right Ventricle: The right ventricular size is normal. No increase in right ventricular wall thickness. Right ventricular systolic function is normal. Left Atrium: Left atrial size was normal in size. 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 Cross-sectional imaging demonstrates cervical and chest lymphadenopathy. EXAM: ULTRASOUND-GUIDED CORE BIOPSY OF LEFT CERVICAL LYMPH NODES MEDICATIONS: 1% lidocaine for local anesthetic ANESTHESIA/SEDATION: None FLUOROSCOPY TIME:  None COMPLICATIONS: None immediate. PROCEDURE: Informed written consent was obtained from the patient after a thorough discussion of the procedural risks, benefits and alternatives. All questions were addressed. A timeout was performed prior to the initiation of the procedure. Both sides of the neck was evaluated with ultrasound. Small irregular lymph nodes identified bilaterally. Group of small irregular lymph nodes on the left side of the neck were targeted for biopsy. Left side of the neck was prepped and draped in sterile fashion. Skin was anesthetized using 1%  lidocaine. Small incision was made. Using ultrasound guidance, 18 gauge core device was directed into the group of lymph nodes. Total of 5 core biopsies were obtained. Specimens placed on a Telfa pad with saline. Bandage placed over the puncture site. FINDINGS: Small irregular lymph nodes on both sides of the neck. Small group of adjacent lymph nodes in the left supraclavicular area were biopsied. Biopsy needle confirmed within the lymph nodes. IMPRESSION: Ultrasound-guided core biopsy of left cervical lymph nodes. Electronically Signed   By: Richarda Overlie M.D.   On: 01/24/2023 11:19   ECHOCARDIOGRAM COMPLETE  Result Date: 01/24/2023    ECHOCARDIOGRAM REPORT   Patient Name:   Hayden Moore Date of Exam: 01/24/2023 Medical Rec #:  [id number]     Height:       71.5 in Accession #:    [id number]    Weight:       238.5 lb Date of Birth:  Mar 19, 1991     BSA:          2.284 m Patient Age:    32 years      BP:           129/103 mmHg Patient Gender: M             HR:           55 bpm. Exam Location:  Inpatient Procedure: 3D Echo Indications:    Thrombus  History:        Patient has no prior history of Echocardiogram examinations.  Sonographer:    Harriette Bouillon Referring Phys: [phone] EMMA M COLLINS IMPRESSIONS  1. 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 Consider CMR for further evaulation. Left ventricular ejection fraction, by estimation, is 55 to 60%. The left ventricle has normal function. The left ventricle has no regional wall motion abnormalities. Left ventricular diastolic function could not be evaluated.  2. Right ventricular systolic function is normal. The right ventricular size is normal.  3. The mitral valve is normal in structure. Trivial mitral valve regurgitation.  4. The aortic valve is normal in structure. Aortic valve regurgitation is trivial. No aortic stenosis is present. 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 Consider CMR for further  evaulation. Left ventricular ejection fraction, by estimation, is 55 to 60%. The left ventricle has normal function. The left ventricle has no regional wall motion abnormalities. The left ventricular internal cavity size was normal in size. There is borderline left ventricular hypertrophy of the apical segment. Left ventricular diastolic function could not be evaluated. Right Ventricle: The right ventricular size is normal. No increase in right ventricular wall thickness. Right ventricular systolic function is normal. Left Atrium: Left atrial size was normal in size. 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 Cross-sectional imaging demonstrates cervical and chest lymphadenopathy. EXAM: ULTRASOUND-GUIDED CORE BIOPSY OF LEFT CERVICAL LYMPH NODES MEDICATIONS: 1% lidocaine for local anesthetic ANESTHESIA/SEDATION: None FLUOROSCOPY TIME:  None COMPLICATIONS: None immediate. PROCEDURE: Informed written consent was obtained from the patient after a thorough discussion of the procedural risks, benefits and alternatives. All questions were addressed. A timeout was performed prior to the initiation of the procedure. Both sides of the neck was evaluated with ultrasound. Small irregular lymph nodes identified bilaterally. Group of small irregular lymph nodes on the left side of the neck were targeted for biopsy. Left side of the neck was prepped and draped in sterile fashion. Skin was anesthetized using 1%  lidocaine. Small incision was made. Using ultrasound guidance, 18 gauge core device was directed into the group of lymph nodes. Total of 5 core biopsies were obtained. Specimens placed on a Telfa pad with saline. Bandage placed over the puncture site. FINDINGS: Small irregular lymph nodes on both sides of the neck. Small group of adjacent lymph nodes in the left supraclavicular area were biopsied. Biopsy needle confirmed within the lymph nodes. IMPRESSION: Ultrasound-guided core biopsy of left cervical lymph nodes. Electronically Signed   By: Richarda Overlie M.D.   On: 01/24/2023 11:19   ECHOCARDIOGRAM COMPLETE  Result Date: 01/24/2023    ECHOCARDIOGRAM REPORT   Patient Name:   Hayden Moore Date of Exam: 01/24/2023 Medical Rec #:  [id number]     Height:       71.5 in Accession #:    [id number]    Weight:       238.5 lb Date of Birth:  Mar 19, 1991     BSA:          2.284 m Patient Age:    32 years      BP:           129/103 mmHg Patient Gender: M             HR:           55 bpm. Exam Location:  Inpatient Procedure: 3D Echo Indications:    Thrombus  History:        Patient has no prior history of Echocardiogram examinations.  Sonographer:    Harriette Bouillon Referring Phys: [phone] EMMA M COLLINS IMPRESSIONS  1. 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 Consider CMR for further evaulation. Left ventricular ejection fraction, by estimation, is 55 to 60%. The left ventricle has normal function. The left ventricle has no regional wall motion abnormalities. Left ventricular diastolic function could not be evaluated.  2. Right ventricular systolic function is normal. The right ventricular size is normal.  3. The mitral valve is normal in structure. Trivial mitral valve regurgitation.  4. The aortic valve is normal in structure. Aortic valve regurgitation is trivial. No aortic stenosis is present. 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 Consider CMR for further  evaulation. Left ventricular ejection fraction, by estimation, is 55 to 60%. The left ventricle has normal function. The left ventricle has no regional wall motion abnormalities. The left ventricular internal cavity size was normal in size. There is borderline left ventricular hypertrophy of the apical segment. Left ventricular diastolic function could not be evaluated. Right Ventricle: The right ventricular size is normal. No increase in right ventricular wall thickness. Right ventricular systolic function is normal. Left Atrium: Left atrial size was normal in size. 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 Cross-sectional imaging demonstrates cervical and chest lymphadenopathy. EXAM: ULTRASOUND-GUIDED CORE BIOPSY OF LEFT CERVICAL LYMPH NODES MEDICATIONS: 1% lidocaine for local anesthetic ANESTHESIA/SEDATION: None FLUOROSCOPY TIME:  None COMPLICATIONS: None immediate. PROCEDURE: Informed written consent was obtained from the patient after a thorough discussion of the procedural risks, benefits and alternatives. All questions were addressed. A timeout was performed prior to the initiation of the procedure. Both sides of the neck was evaluated with ultrasound. Small irregular lymph nodes identified bilaterally. Group of small irregular lymph nodes on the left side of the neck were targeted for biopsy. Left side of the neck was prepped and draped in sterile fashion. Skin was anesthetized using 1%  lidocaine. Small incision was made. Using ultrasound guidance, 18 gauge core device was directed into the group of lymph nodes. Total of 5 core biopsies were obtained. Specimens placed on a Telfa pad with saline. Bandage placed over the puncture site. FINDINGS: Small irregular lymph nodes on both sides of the neck. Small group of adjacent lymph nodes in the left supraclavicular area were biopsied. Biopsy needle confirmed within the lymph nodes. IMPRESSION: Ultrasound-guided core biopsy of left cervical lymph nodes. Electronically Signed   By: Richarda Overlie M.D.   On: 01/24/2023 11:19   ECHOCARDIOGRAM COMPLETE  Result Date: 01/24/2023    ECHOCARDIOGRAM REPORT   Patient Name:   Hayden Moore Date of Exam: 01/24/2023 Medical Rec #:  [id number]     Height:       71.5 in Accession #:    [id number]    Weight:       238.5 lb Date of Birth:  Mar 19, 1991     BSA:          2.284 m Patient Age:    32 years      BP:           129/103 mmHg Patient Gender: M             HR:           55 bpm. Exam Location:  Inpatient Procedure: 3D Echo Indications:    Thrombus  History:        Patient has no prior history of Echocardiogram examinations.  Sonographer:    Harriette Bouillon Referring Phys: [phone] EMMA M [facility] IMPRESSIONS  1. 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 Consider CMR for further evaulation. Left ventricular ejection fraction, by estimation, is 55 to 60%. The left ventricle has normal function. The left ventricle has no regional wall motion abnormalities. Left ventricular diastolic function could not be evaluated.  2. Right ventricular systolic function is normal. The right ventricular size is normal.  3. The mitral valve is normal in structure. Trivial mitral valve regurgitation.  4. The aortic valve is normal in structure. Aortic valve regurgitation is trivial. No aortic stenosis is present. 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 Consider CMR for further  evaulation. Left ventricular ejection fraction, by estimation, is 55 to 60%. The left ventricle has normal function. The left ventricle has no regional wall motion abnormalities. The left ventricular internal cavity size was normal in size. There is borderline left ventricular hypertrophy of the apical segment. Left ventricular diastolic function could not be evaluated. Right Ventricle: The right ventricular size is normal. No increase in right ventricular wall thickness. Right ventricular systolic function is normal. Left Atrium: Left atrial size was normal in size. Right Atrium: Right atrial size was  cervical and chest lymphadenopathy. EXAM: ULTRASOUND-GUIDED CORE BIOPSY OF LEFT CERVICAL LYMPH NODES MEDICATIONS: 1% lidocaine for local anesthetic ANESTHESIA/SEDATION: None FLUOROSCOPY TIME:  None COMPLICATIONS: None immediate. PROCEDURE: Informed written consent was obtained from the patient after a thorough discussion of the procedural risks, benefits and alternatives. All questions were addressed. A timeout was performed prior to the initiation of the procedure. Both sides of the neck was evaluated with ultrasound. Small irregular lymph nodes identified bilaterally. Group of small irregular lymph nodes on the left side of the neck were targeted for biopsy. Left side of the neck was prepped and draped in sterile fashion. Skin was anesthetized using 1% lidocaine. Small incision was made. Using ultrasound guidance, 18 gauge core device was directed into the group of lymph nodes. Total of 5 core biopsies were obtained. Specimens placed on a Telfa pad with saline. Bandage placed over the puncture site. FINDINGS: Small irregular lymph nodes on both sides of the neck. Small group of adjacent lymph nodes in the left supraclavicular area were biopsied. Biopsy needle confirmed within the lymph nodes. IMPRESSION: Ultrasound-guided core biopsy of left cervical lymph nodes. Electronically Signed   By: Richarda Overlie M.D.   On: 01/24/2023 11:19   ECHOCARDIOGRAM COMPLETE  Result Date: 01/24/2023    ECHOCARDIOGRAM REPORT   Patient Name:   Hayden Moore Date of Exam: 01/24/2023 Medical Rec #:  [id number]     Height:       71.5 in Accession #:    [id number]    Weight:       238.5 lb Date of Birth:  [birth year]     BSA:          2.284 m Patient Age:    32 years      BP:           129/103 mmHg Patient Gender: M             HR:           55 bpm. Exam Location:  Inpatient Procedure:  3D Echo Indications:    Thrombus  History:        Patient has no prior history of Echocardiogram examinations.  Sonographer:    Harriette Bouillon Referring Phys: [phone] EMMA M [facility] IMPRESSIONS  1. 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 Consider CMR for further evaulation. Left ventricular ejection fraction, by estimation, is 55 to 60%. The left ventricle has normal function. The left ventricle has no regional wall motion abnormalities. Left ventricular diastolic function could not be evaluated.  2. Right ventricular systolic function is normal. The right ventricular size is normal.  3. The mitral valve is normal in structure. Trivial mitral valve regurgitation.  4. The aortic valve is normal in structure. Aortic valve regurgitation is trivial. No aortic stenosis is present. 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 Consider CMR for further evaulation. Left ventricular ejection fraction, by estimation, is 55 to 60%. The left ventricle has normal function. The left ventricle has no regional wall motion abnormalities. The left ventricular internal cavity size was normal in size. There is borderline left ventricular hypertrophy of the apical segment. Left ventricular diastolic function could not be evaluated. Right Ventricle: The right ventricular size is normal. No increase in right ventricular wall thickness. Right ventricular systolic function is normal. Left Atrium: Left atrial size was normal in size. Right Atrium: Right atrial size was normal in size. Pericardium: There is no evidence of pericardial effusion. Mitral Valve: The

## 2023-01-24 NOTE — Progress Notes (Signed)
OT Cancellation Note  Patient Details Name: Hayden Moore MRN: [id number] DOB: 12/10/1990   Cancelled Treatment:    Reason Eval/Treat Not Completed: Patient at procedure or test/ unavailable Pt leaving unit for IR. OT will return as time allows and pt is appropriate.   Providence Crosby 01/24/2023, 9:04 AM

## 2023-01-25 ENCOUNTER — Inpatient Hospital Stay (HOSPITAL_COMMUNITY): Payer: Self-pay

## 2023-01-25 ENCOUNTER — Other Ambulatory Visit (HOSPITAL_COMMUNITY): Payer: Self-pay

## 2023-01-25 DIAGNOSIS — I742 Embolism and thrombosis of arteries of the upper extremities: Secondary | ICD-10-CM

## 2023-01-25 DIAGNOSIS — R0989 Other specified symptoms and signs involving the circulatory and respiratory systems: Secondary | ICD-10-CM

## 2023-01-25 LAB — SURGICAL PATHOLOGY

## 2023-01-25 MED ORDER — APIXABAN 5 MG PO TABS
5.0000 mg | ORAL_TABLET | Freq: Two times a day (BID) | ORAL | 1 refills | Status: DC
Start: 2023-01-25 — End: 2023-01-26
  Filled 2023-01-25: qty 60, 30d supply, fill #0

## 2023-01-25 MED ORDER — GADOBUTROL 1 MMOL/ML IV SOLN
10.0000 mL | Freq: Once | INTRAVENOUS | Status: AC | PRN
  Administered 2023-01-25: 10 mL via INTRAVENOUS

## 2023-01-25 NOTE — Plan of Care (Signed)
  Problem: Health Behavior/Discharge Planning: Goal: Ability to manage health-related needs will improve Outcome: Progressing   Problem: Education: Goal: Knowledge of General Education information will improve Description: Including pain rating scale, medication(s)/side effects and non-pharmacologic comfort measures Outcome: Progressing   

## 2023-01-25 NOTE — Discharge Summary (Signed)
neck was evaluated with ultrasound. Small irregular lymph nodes identified bilaterally. Group of small irregular lymph nodes on the left side of the neck were targeted for biopsy. Left side of the neck was prepped and draped in sterile fashion. Skin was anesthetized using 1% lidocaine. Small incision was made. Using ultrasound guidance, 18 gauge core device was directed into the group of lymph nodes. Total of 5 core biopsies were obtained. Specimens placed on a Telfa pad with saline. Bandage placed over the puncture site. FINDINGS: Small irregular lymph nodes on both sides of the neck. Small group of adjacent lymph nodes in the left supraclavicular area were biopsied. Biopsy needle confirmed within the lymph nodes. IMPRESSION: Ultrasound-guided core biopsy of left cervical lymph nodes. Electronically Signed   By: Richarda Overlie M.D.   On: 01/24/2023 11:19   ECHOCARDIOGRAM COMPLETE  Result Date: 01/24/2023    ECHOCARDIOGRAM REPORT   Patient Name:   Hayden Moore Date of Exam: 01/24/2023 Medical Rec #:  [id number]     Height:       71.5 in Accession #:    [id number]    Weight:       238.5 lb Date of Birth:  1990-10-08     BSA:          2.284 m Patient Age:    32 years      BP:           129/103 mmHg Patient Gender: M             HR:           55 bpm. Exam Location:  Inpatient Procedure: 3D Echo Indications:    Thrombus  History:        Patient has no prior history of Echocardiogram examinations.  Sonographer:    Harriette Bouillon Referring Phys: [phone] EMMA M COLLINS IMPRESSIONS  1. 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 Consider CMR for further evaulation. Left ventricular ejection fraction, by estimation, is 55 to 60%. The left ventricle has normal function. The left ventricle has no regional wall motion abnormalities. Left ventricular diastolic function could not be evaluated.  2. Right ventricular systolic function is normal. The right ventricular size is normal.  3. The mitral valve is  normal in structure. Trivial mitral valve regurgitation.  4. The aortic valve is normal in structure. Aortic valve regurgitation is trivial. No aortic stenosis is present. 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 Consider CMR for further evaulation. Left ventricular ejection fraction, by estimation, is 55 to 60%. The left ventricle has normal function. The left ventricle has no regional wall motion abnormalities. The left ventricular internal cavity size was normal in size. There is borderline left ventricular hypertrophy of the apical segment. Left ventricular diastolic function could not be evaluated. Right Ventricle: The right ventricular size is normal. No increase in right ventricular wall thickness. Right ventricular systolic function is normal. Left Atrium: Left atrial size was normal in size. Right Atrium: Right atrial size was normal in size. Pericardium: There is no evidence of pericardial effusion. Mitral Valve: The mitral valve is normal in structure. Trivial mitral valve regurgitation. Tricuspid Valve: The tricuspid valve is grossly normal. Tricuspid valve regurgitation is trivial. Aortic Valve: The aortic valve is normal in structure. Aortic valve regurgitation is trivial. No aortic stenosis is present. Pulmonic Valve: The pulmonic valve was normal in structure. Pulmonic valve regurgitation is not visualized. Aorta: The aortic root and ascending aorta are structurally normal,  neck was evaluated with ultrasound. Small irregular lymph nodes identified bilaterally. Group of small irregular lymph nodes on the left side of the neck were targeted for biopsy. Left side of the neck was prepped and draped in sterile fashion. Skin was anesthetized using 1% lidocaine. Small incision was made. Using ultrasound guidance, 18 gauge core device was directed into the group of lymph nodes. Total of 5 core biopsies were obtained. Specimens placed on a Telfa pad with saline. Bandage placed over the puncture site. FINDINGS: Small irregular lymph nodes on both sides of the neck. Small group of adjacent lymph nodes in the left supraclavicular area were biopsied. Biopsy needle confirmed within the lymph nodes. IMPRESSION: Ultrasound-guided core biopsy of left cervical lymph nodes. Electronically Signed   By: Richarda Overlie M.D.   On: 01/24/2023 11:19   ECHOCARDIOGRAM COMPLETE  Result Date: 01/24/2023    ECHOCARDIOGRAM REPORT   Patient Name:   Hayden Moore Date of Exam: 01/24/2023 Medical Rec #:  [id number]     Height:       71.5 in Accession #:    [id number]    Weight:       238.5 lb Date of Birth:  1990-10-08     BSA:          2.284 m Patient Age:    32 years      BP:           129/103 mmHg Patient Gender: M             HR:           55 bpm. Exam Location:  Inpatient Procedure: 3D Echo Indications:    Thrombus  History:        Patient has no prior history of Echocardiogram examinations.  Sonographer:    Harriette Bouillon Referring Phys: [phone] EMMA M COLLINS IMPRESSIONS  1. 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 Consider CMR for further evaulation. Left ventricular ejection fraction, by estimation, is 55 to 60%. The left ventricle has normal function. The left ventricle has no regional wall motion abnormalities. Left ventricular diastolic function could not be evaluated.  2. Right ventricular systolic function is normal. The right ventricular size is normal.  3. The mitral valve is  normal in structure. Trivial mitral valve regurgitation.  4. The aortic valve is normal in structure. Aortic valve regurgitation is trivial. No aortic stenosis is present. 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 Consider CMR for further evaulation. Left ventricular ejection fraction, by estimation, is 55 to 60%. The left ventricle has normal function. The left ventricle has no regional wall motion abnormalities. The left ventricular internal cavity size was normal in size. There is borderline left ventricular hypertrophy of the apical segment. Left ventricular diastolic function could not be evaluated. Right Ventricle: The right ventricular size is normal. No increase in right ventricular wall thickness. Right ventricular systolic function is normal. Left Atrium: Left atrial size was normal in size. Right Atrium: Right atrial size was normal in size. Pericardium: There is no evidence of pericardial effusion. Mitral Valve: The mitral valve is normal in structure. Trivial mitral valve regurgitation. Tricuspid Valve: The tricuspid valve is grossly normal. Tricuspid valve regurgitation is trivial. Aortic Valve: The aortic valve is normal in structure. Aortic valve regurgitation is trivial. No aortic stenosis is present. Pulmonic Valve: The pulmonic valve was normal in structure. Pulmonic valve regurgitation is not visualized. Aorta: The aortic root and ascending aorta are structurally normal,  Physician Discharge Summary  Mccall Lomax XBJ:[id number] DOB: [birth year] DOA: 01/22/2023  PCP: Georganna Skeans, MD  Admit date: 01/22/2023 Discharge date: 01/25/2023  Admitted From: Home Disposition:  Home  Recommendations for Outpatient Follow-up:  Follow up with PCP in 1-2 weeks Follow up with vascular surgery as scheduled  Home Health:None  Equipment/Devices:None  Discharge Condition:Stable  CODE STATUS:Full  Diet recommendation: Low salt low fat diet    Brief/Interim Summary: 32 year old with history of hypertension, smoker who presented to the hospital with 3 days of cool, painful and loss of sensation of right upper extremity, he woke up from sleep like that.  No previous history of thromboembolism.  No history of trauma.  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  Reported transient vision loss 3 months ago but improved now.  In the emergency room hemodynamically stable.  Loss of sensation medial right elbow, intact vasculature.   Patient admitted as above with worsening right upper extremity pain, CTA at intake shows thrombus in the axillary artery as well as the brachial bifurcation.  Initially placed on heparin drip admitted with vascular surgery consult.  On 9/30 patient underwent a right brachial artery ulnar artery radial artery and axillary artery embolectomy.  On 10/1 patient had cervical lymph node core biopsy given 40 pound weight loss past few days.  Patient specialized testing to rule evaluate for cardiac sarcoid, unfortunately cardiac MRI was, of note patient's ANA factor, C3 and C4 were all negative and within normal limits.  At this time resolution of symptoms and procedure relatively stable for discharge.  Patient be discharged home on Eliquis with a coupon for free marked.  Case management has been working diligently with patient to ensure assistance for future refills.  With vascular surgery as scheduled.   Discharge Diagnoses:  Principal Problem:   Arterial embolism of upper  extremity (HCC)    Discharge Instructions   Allergies as of 01/25/2023   No Known Allergies      Medication List     STOP taking these medications    meloxicam 15 MG tablet Commonly known as: MOBIC       TAKE these medications    apixaban 5 MG Tabs tablet Commonly known as: ELIQUIS Take 1 tablet (5 mg total) by mouth 2 (two) times daily.   lisinopril 10 MG tablet Commonly known as: ZESTRIL TAKE 1 TABLET BY MOUTH EVERY DAY        No Known Allergies  Consultations: Vascular surgery   Procedures/Studies: MR CARDIAC MORPHOLOGY W WO CONTRAST  Result Date: 01/25/2023 CLINICAL DATA:  18M with hypertension, tobacco use p/w arterial thromboembolism to right upper extremity. 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 A dedicated cardiac coil was used. Functional imaging was done using Fiesta sequences. 2,3, and 4 chamber views were done to assess for RWMA's. Modified Simpson's rule using a short axis stack was used to calculate an ejection fraction on a dedicated work [employer], [employer]. The patient received 10 cc of Gadavist. After 10 minutes inversion recovery sequences were used to assess for infiltration and scar tissue. 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 LV EF: 53% (Normal 49-79%) Absolute volumes:  Physician Discharge Summary  Mccall Lomax XBJ:[id number] DOB: [birth year] DOA: 01/22/2023  PCP: Georganna Skeans, MD  Admit date: 01/22/2023 Discharge date: 01/25/2023  Admitted From: Home Disposition:  Home  Recommendations for Outpatient Follow-up:  Follow up with PCP in 1-2 weeks Follow up with vascular surgery as scheduled  Home Health:None  Equipment/Devices:None  Discharge Condition:Stable  CODE STATUS:Full  Diet recommendation: Low salt low fat diet    Brief/Interim Summary: 32 year old with history of hypertension, smoker who presented to the hospital with 3 days of cool, painful and loss of sensation of right upper extremity, he woke up from sleep like that.  No previous history of thromboembolism.  No history of trauma.  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  Reported transient vision loss 3 months ago but improved now.  In the emergency room hemodynamically stable.  Loss of sensation medial right elbow, intact vasculature.   Patient admitted as above with worsening right upper extremity pain, CTA at intake shows thrombus in the axillary artery as well as the brachial bifurcation.  Initially placed on heparin drip admitted with vascular surgery consult.  On 9/30 patient underwent a right brachial artery ulnar artery radial artery and axillary artery embolectomy.  On 10/1 patient had cervical lymph node core biopsy given 40 pound weight loss past few days.  Patient specialized testing to rule evaluate for cardiac sarcoid, unfortunately cardiac MRI was, of note patient's ANA factor, C3 and C4 were all negative and within normal limits.  At this time resolution of symptoms and procedure relatively stable for discharge.  Patient be discharged home on Eliquis with a coupon for free marked.  Case management has been working diligently with patient to ensure assistance for future refills.  With vascular surgery as scheduled.   Discharge Diagnoses:  Principal Problem:   Arterial embolism of upper  extremity (HCC)    Discharge Instructions   Allergies as of 01/25/2023   No Known Allergies      Medication List     STOP taking these medications    meloxicam 15 MG tablet Commonly known as: MOBIC       TAKE these medications    apixaban 5 MG Tabs tablet Commonly known as: ELIQUIS Take 1 tablet (5 mg total) by mouth 2 (two) times daily.   lisinopril 10 MG tablet Commonly known as: ZESTRIL TAKE 1 TABLET BY MOUTH EVERY DAY        No Known Allergies  Consultations: Vascular surgery   Procedures/Studies: MR CARDIAC MORPHOLOGY W WO CONTRAST  Result Date: 01/25/2023 CLINICAL DATA:  18M with hypertension, tobacco use p/w arterial thromboembolism to right upper extremity. 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 A dedicated cardiac coil was used. Functional imaging was done using Fiesta sequences. 2,3, and 4 chamber views were done to assess for RWMA's. Modified Simpson's rule using a short axis stack was used to calculate an ejection fraction on a dedicated work [employer] officer, [employer]. The patient received 10 cc of Gadavist. After 10 minutes inversion recovery sequences were used to assess for infiltration and scar tissue. 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 LV EF: 53% (Normal 49-79%) Absolute volumes:  Physician Discharge Summary  Mccall Lomax XBJ:[id number] DOB: [birth year] DOA: 01/22/2023  PCP: Georganna Skeans, MD  Admit date: 01/22/2023 Discharge date: 01/25/2023  Admitted From: Home Disposition:  Home  Recommendations for Outpatient Follow-up:  Follow up with PCP in 1-2 weeks Follow up with vascular surgery as scheduled  Home Health:None  Equipment/Devices:None  Discharge Condition:Stable  CODE STATUS:Full  Diet recommendation: Low salt low fat diet    Brief/Interim Summary: 32 year old with history of hypertension, smoker who presented to the hospital with 3 days of cool, painful and loss of sensation of right upper extremity, he woke up from sleep like that.  No previous history of thromboembolism.  No history of trauma.  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  Reported transient vision loss 3 months ago but improved now.  In the emergency room hemodynamically stable.  Loss of sensation medial right elbow, intact vasculature.   Patient admitted as above with worsening right upper extremity pain, CTA at intake shows thrombus in the axillary artery as well as the brachial bifurcation.  Initially placed on heparin drip admitted with vascular surgery consult.  On 9/30 patient underwent a right brachial artery ulnar artery radial artery and axillary artery embolectomy.  On 10/1 patient had cervical lymph node core biopsy given 40 pound weight loss past few days.  Patient specialized testing to rule evaluate for cardiac sarcoid, unfortunately cardiac MRI was, of note patient's ANA factor, C3 and C4 were all negative and within normal limits.  At this time resolution of symptoms and procedure relatively stable for discharge.  Patient be discharged home on Eliquis with a coupon for free marked.  Case management has been working diligently with patient to ensure assistance for future refills.  With vascular surgery as scheduled.   Discharge Diagnoses:  Principal Problem:   Arterial embolism of upper  extremity (HCC)    Discharge Instructions   Allergies as of 01/25/2023   No Known Allergies      Medication List     STOP taking these medications    meloxicam 15 MG tablet Commonly known as: MOBIC       TAKE these medications    apixaban 5 MG Tabs tablet Commonly known as: ELIQUIS Take 1 tablet (5 mg total) by mouth 2 (two) times daily.   lisinopril 10 MG tablet Commonly known as: ZESTRIL TAKE 1 TABLET BY MOUTH EVERY DAY        No Known Allergies  Consultations: Vascular surgery   Procedures/Studies: MR CARDIAC MORPHOLOGY W WO CONTRAST  Result Date: 01/25/2023 CLINICAL DATA:  18M with hypertension, tobacco use p/w arterial thromboembolism to right upper extremity. 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 A dedicated cardiac coil was used. Functional imaging was done using Fiesta sequences. 2,3, and 4 chamber views were done to assess for RWMA's. Modified Simpson's rule using a short axis stack was used to calculate an ejection fraction on a dedicated work [employer] officer, [employer]. The patient received 10 cc of Gadavist. After 10 minutes inversion recovery sequences were used to assess for infiltration and scar tissue. 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 LV EF: 53% (Normal 49-79%) Absolute volumes:  neck was evaluated with ultrasound. Small irregular lymph nodes identified bilaterally. Group of small irregular lymph nodes on the left side of the neck were targeted for biopsy. Left side of the neck was prepped and draped in sterile fashion. Skin was anesthetized using 1% lidocaine. Small incision was made. Using ultrasound guidance, 18 gauge core device was directed into the group of lymph nodes. Total of 5 core biopsies were obtained. Specimens placed on a Telfa pad with saline. Bandage placed over the puncture site. FINDINGS: Small irregular lymph nodes on both sides of the neck. Small group of adjacent lymph nodes in the left supraclavicular area were biopsied. Biopsy needle confirmed within the lymph nodes. IMPRESSION: Ultrasound-guided core biopsy of left cervical lymph nodes. Electronically Signed   By: Richarda Overlie M.D.   On: 01/24/2023 11:19   ECHOCARDIOGRAM COMPLETE  Result Date: 01/24/2023    ECHOCARDIOGRAM REPORT   Patient Name:   Hayden Moore Date of Exam: 01/24/2023 Medical Rec #:  [id number]     Height:       71.5 in Accession #:    [id number]    Weight:       238.5 lb Date of Birth:  1990-10-08     BSA:          2.284 m Patient Age:    32 years      BP:           129/103 mmHg Patient Gender: M             HR:           55 bpm. Exam Location:  Inpatient Procedure: 3D Echo Indications:    Thrombus  History:        Patient has no prior history of Echocardiogram examinations.  Sonographer:    Harriette Bouillon Referring Phys: [phone] EMMA M COLLINS IMPRESSIONS  1. 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 Consider CMR for further evaulation. Left ventricular ejection fraction, by estimation, is 55 to 60%. The left ventricle has normal function. The left ventricle has no regional wall motion abnormalities. Left ventricular diastolic function could not be evaluated.  2. Right ventricular systolic function is normal. The right ventricular size is normal.  3. The mitral valve is  normal in structure. Trivial mitral valve regurgitation.  4. The aortic valve is normal in structure. Aortic valve regurgitation is trivial. No aortic stenosis is present. 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 Consider CMR for further evaulation. Left ventricular ejection fraction, by estimation, is 55 to 60%. The left ventricle has normal function. The left ventricle has no regional wall motion abnormalities. The left ventricular internal cavity size was normal in size. There is borderline left ventricular hypertrophy of the apical segment. Left ventricular diastolic function could not be evaluated. Right Ventricle: The right ventricular size is normal. No increase in right ventricular wall thickness. Right ventricular systolic function is normal. Left Atrium: Left atrial size was normal in size. Right Atrium: Right atrial size was normal in size. Pericardium: There is no evidence of pericardial effusion. Mitral Valve: The mitral valve is normal in structure. Trivial mitral valve regurgitation. Tricuspid Valve: The tricuspid valve is grossly normal. Tricuspid valve regurgitation is trivial. Aortic Valve: The aortic valve is normal in structure. Aortic valve regurgitation is trivial. No aortic stenosis is present. Pulmonic Valve: The pulmonic valve was normal in structure. Pulmonic valve regurgitation is not visualized. Aorta: The aortic root and ascending aorta are structurally normal,  neck was evaluated with ultrasound. Small irregular lymph nodes identified bilaterally. Group of small irregular lymph nodes on the left side of the neck were targeted for biopsy. Left side of the neck was prepped and draped in sterile fashion. Skin was anesthetized using 1% lidocaine. Small incision was made. Using ultrasound guidance, 18 gauge core device was directed into the group of lymph nodes. Total of 5 core biopsies were obtained. Specimens placed on a Telfa pad with saline. Bandage placed over the puncture site. FINDINGS: Small irregular lymph nodes on both sides of the neck. Small group of adjacent lymph nodes in the left supraclavicular area were biopsied. Biopsy needle confirmed within the lymph nodes. IMPRESSION: Ultrasound-guided core biopsy of left cervical lymph nodes. Electronically Signed   By: Richarda Overlie M.D.   On: 01/24/2023 11:19   ECHOCARDIOGRAM COMPLETE  Result Date: 01/24/2023    ECHOCARDIOGRAM REPORT   Patient Name:   Hayden Moore Date of Exam: 01/24/2023 Medical Rec #:  [id number]     Height:       71.5 in Accession #:    [id number]    Weight:       238.5 lb Date of Birth:  1990-10-08     BSA:          2.284 m Patient Age:    32 years      BP:           129/103 mmHg Patient Gender: M             HR:           55 bpm. Exam Location:  Inpatient Procedure: 3D Echo Indications:    Thrombus  History:        Patient has no prior history of Echocardiogram examinations.  Sonographer:    Harriette Bouillon Referring Phys: [phone] EMMA M COLLINS IMPRESSIONS  1. 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 Consider CMR for further evaulation. Left ventricular ejection fraction, by estimation, is 55 to 60%. The left ventricle has normal function. The left ventricle has no regional wall motion abnormalities. Left ventricular diastolic function could not be evaluated.  2. Right ventricular systolic function is normal. The right ventricular size is normal.  3. The mitral valve is  normal in structure. Trivial mitral valve regurgitation.  4. The aortic valve is normal in structure. Aortic valve regurgitation is trivial. No aortic stenosis is present. 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 Consider CMR for further evaulation. Left ventricular ejection fraction, by estimation, is 55 to 60%. The left ventricle has normal function. The left ventricle has no regional wall motion abnormalities. The left ventricular internal cavity size was normal in size. There is borderline left ventricular hypertrophy of the apical segment. Left ventricular diastolic function could not be evaluated. Right Ventricle: The right ventricular size is normal. No increase in right ventricular wall thickness. Right ventricular systolic function is normal. Left Atrium: Left atrial size was normal in size. Right Atrium: Right atrial size was normal in size. Pericardium: There is no evidence of pericardial effusion. Mitral Valve: The mitral valve is normal in structure. Trivial mitral valve regurgitation. Tricuspid Valve: The tricuspid valve is grossly normal. Tricuspid valve regurgitation is trivial. Aortic Valve: The aortic valve is normal in structure. Aortic valve regurgitation is trivial. No aortic stenosis is present. Pulmonic Valve: The pulmonic valve was normal in structure. Pulmonic valve regurgitation is not visualized. Aorta: The aortic root and ascending aorta are structurally normal,  neck was evaluated with ultrasound. Small irregular lymph nodes identified bilaterally. Group of small irregular lymph nodes on the left side of the neck were targeted for biopsy. Left side of the neck was prepped and draped in sterile fashion. Skin was anesthetized using 1% lidocaine. Small incision was made. Using ultrasound guidance, 18 gauge core device was directed into the group of lymph nodes. Total of 5 core biopsies were obtained. Specimens placed on a Telfa pad with saline. Bandage placed over the puncture site. FINDINGS: Small irregular lymph nodes on both sides of the neck. Small group of adjacent lymph nodes in the left supraclavicular area were biopsied. Biopsy needle confirmed within the lymph nodes. IMPRESSION: Ultrasound-guided core biopsy of left cervical lymph nodes. Electronically Signed   By: Richarda Overlie M.D.   On: 01/24/2023 11:19   ECHOCARDIOGRAM COMPLETE  Result Date: 01/24/2023    ECHOCARDIOGRAM REPORT   Patient Name:   Hayden Moore Date of Exam: 01/24/2023 Medical Rec #:  [id number]     Height:       71.5 in Accession #:    [id number]    Weight:       238.5 lb Date of Birth:  1990-10-08     BSA:          2.284 m Patient Age:    32 years      BP:           129/103 mmHg Patient Gender: M             HR:           55 bpm. Exam Location:  Inpatient Procedure: 3D Echo Indications:    Thrombus  History:        Patient has no prior history of Echocardiogram examinations.  Sonographer:    Harriette Bouillon Referring Phys: [phone] EMMA M COLLINS IMPRESSIONS  1. 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 Consider CMR for further evaulation. Left ventricular ejection fraction, by estimation, is 55 to 60%. The left ventricle has normal function. The left ventricle has no regional wall motion abnormalities. Left ventricular diastolic function could not be evaluated.  2. Right ventricular systolic function is normal. The right ventricular size is normal.  3. The mitral valve is  normal in structure. Trivial mitral valve regurgitation.  4. The aortic valve is normal in structure. Aortic valve regurgitation is trivial. No aortic stenosis is present. 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 Consider CMR for further evaulation. Left ventricular ejection fraction, by estimation, is 55 to 60%. The left ventricle has normal function. The left ventricle has no regional wall motion abnormalities. The left ventricular internal cavity size was normal in size. There is borderline left ventricular hypertrophy of the apical segment. Left ventricular diastolic function could not be evaluated. Right Ventricle: The right ventricular size is normal. No increase in right ventricular wall thickness. Right ventricular systolic function is normal. Left Atrium: Left atrial size was normal in size. Right Atrium: Right atrial size was normal in size. Pericardium: There is no evidence of pericardial effusion. Mitral Valve: The mitral valve is normal in structure. Trivial mitral valve regurgitation. Tricuspid Valve: The tricuspid valve is grossly normal. Tricuspid valve regurgitation is trivial. Aortic Valve: The aortic valve is normal in structure. Aortic valve regurgitation is trivial. No aortic stenosis is present. Pulmonic Valve: The pulmonic valve was normal in structure. Pulmonic valve regurgitation is not visualized. Aorta: The aortic root and ascending aorta are structurally normal,

## 2023-01-25 NOTE — Discharge Instructions (Addendum)
Information on my medicine - ELIQUIS (apixaban)  This medication education was reviewed with me or my healthcare representative as part of my discharge preparation.  The pharmacist that spoke with me during my hospital stay was:  Elisha Headland Shelsy Seng, RPH  Why was Eliquis prescribed for you? Eliquis was prescribed to treat blood clots that may have been found in the veins of your legs (deep vein thrombosis) or in your lungs (pulmonary embolism) and to reduce the risk of them occurring again.  What do You need to know about Eliquis ? The dose is 5 mg tablet taken TWICE daily.  Eliquis may be taken with or without food.   Try to take the dose about the same time in the morning and in the evening. If you have difficulty swallowing the tablet whole please discuss with your pharmacist how to take the medication safely.  Take Eliquis exactly as prescribed and DO NOT stop taking Eliquis without talking to the doctor who prescribed the medication.  Stopping may increase your risk of developing a new blood clot.  Refill your prescription before you run out.  After discharge, you should have regular check-up appointments with your healthcare provider that is prescribing your Eliquis.    What do you do if you miss a dose? If a dose of ELIQUIS is not taken at the scheduled time, take it as soon as possible on the same day and twice-daily administration should be resumed. The dose should not be doubled to make up for a missed dose.  Important Safety Information A possible side effect of Eliquis is bleeding. You should call your healthcare provider right away if you experience any of the following: Bleeding from an injury or your nose that does not stop. Unusual colored urine (red or dark brown) or unusual colored stools (red or black). Unusual bruising for unknown reasons. A serious fall or if you hit your head (even if there is no bleeding).  Some medicines may interact with Eliquis and might  increase your risk of bleeding or clotting while on Eliquis. To help avoid this, consult your healthcare provider or pharmacist prior to using any new prescription or non-prescription medications, including herbals, vitamins, non-steroidal anti-inflammatory drugs (NSAIDs) and supplements.  This website has more information on Eliquis (apixaban): http://www.eliquis.com/eliquis/home

## 2023-01-25 NOTE — Progress Notes (Addendum)
  Progress Note    01/25/2023 7:30 AM 2 Days Post-Op  Subjective:  pt off the floor  afebrile  Vitals:   01/24/23 2300 01/25/23 0248  BP: 123/83 128/82  Pulse:  (!) 56  Resp: 20 19  Temp: 98.3 F (36.8 C) 97.7 F (36.5 C)  SpO2: 100% 99%     CBC    Component Value Date/Time   WBC 10.4 01/24/2023 0437   RBC 4.38 01/24/2023 0437   HGB 12.7 (L) 01/24/2023 0437   HCT 39.1 01/24/2023 0437   PLT 261 01/24/2023 0437   MCV 89.3 01/24/2023 0437   MCH 29.0 01/24/2023 0437   MCHC 32.5 01/24/2023 0437   RDW 13.7 01/24/2023 0437   LYMPHSABS 1.2 01/22/2023 1323   MONOABS 0.8 01/22/2023 1323   EOSABS 0.2 01/22/2023 1323   BASOSABS 0.0 01/22/2023 1323    BMET    Component Value Date/Time   NA 135 01/23/2023 0148   NA 139 08/10/2022 1029   K 3.2 (L) 01/23/2023 0148   CL 101 01/23/2023 0148   CO2 26 01/23/2023 0148   GLUCOSE 118 (H) 01/23/2023 0148   BUN 8 01/23/2023 0148   BUN 5 (L) 08/10/2022 1029   CREATININE 1.17 01/23/2023 0148   CALCIUM 8.5 (L) 01/23/2023 0148   GFRNONAA >60 01/23/2023 0148   GFRAA >60 05/04/2019 1452    INR    Component Value Date/Time   INR 1.1 01/22/2023 1323     Intake/Output Summary (Last 24 hours) at 01/25/2023 0730 Last data filed at 01/24/2023 2118 Gross per 24 hour  Intake 3 ml  Output --  Net 3 ml      Assessment/Plan:  32 y.o. male is s/p:  Right brachial artery, ulnar artery, radial artery, axillary artery embolectomy   2 Days Post-Op   -continued workup noted and had biopsy of lymphadenopathy yesterday with hematology/oncology workup. -pt will f/u with vascular surgery outpatient in a couple of weeks to check his incision.   -DVT prophylaxis:  heparin gtt with transition to DOAC for discharge and length of need per hematology   Doreatha Massed, PA-C [facility] [phone] 01/25/2023 7:30 AM

## 2023-01-25 NOTE — Progress Notes (Signed)
Patient was instructed to go to any pharmacy to have the 30 days supply Eliquis filled. Patient is independent and packed all his belongings. Patient is wheeled down to the Er and picked up by his mother.

## 2023-01-25 NOTE — TOC Transition Note (Signed)
Transition of Care (TOC) - CM/SW Discharge Note Donn Pierini RN, BSN Transitions of Care Unit 4E- RN Case Manager See Treatment Team for direct phone #   Patient Details  Name: Hayden Moore MRN: [id number] Date of Birth: 02-14-1991  Transition of Care [facility]) CM/SW Contact:  Darrold Span, RN Phone Number: 01/25/2023, 8:18 PM   Clinical Narrative:    Pt stable to transition home, note d/c order has been placed. CM has been trying to reach pt throughout the day without succuss as pts phone in the room has been broken. Attempted to call phone # in chart- however had to leave VM.  CM had printed Eliquis pt assistance application earlier in the day and had bedside RN take to pt, pt has completed his part, and RN faxed to Eliquis, MD to fill out prescriber section.   CM received return call from mother who's # is one listed as pt's home #/work, mother provided CM pt's cell #- [phone].   Call made to pt at bedside- confirmed with pt that he does not have insurance, PCP is Dr. Andrey Campanile. Discussed with pt Eliquis pt assistance and application process. Pt to f/u and call Eliquis assist. Phone # to f/u on application, pt voiced he did file taxed last year and understands he will need to provide that info to the assistance program. Pt also to f/u with his PCP or the VSS office should he need any further assistance with application or Eliquis needs prior to application approval.  Unit staff to provide 30 day trail coupon to use on discharge. [facility] [address] [address] [facility] should he need alternate.   Pt voiced that he had no further questions, and appreciated assistance.   No further TOC needs at this time.    Final next level of care: Home/Self Care Barriers to Discharge: No Barriers Identified   Patient Goals and CMS Choice   Choice offered to / list presented to : NA  Discharge Placement                   Home       Discharge Plan and Services Additional resources added to the After Visit Summary for     Discharge Planning Services: CM Consult, Medication Assistance Post Acute Care Choice: NA          DME Arranged: N/A DME Agency: NA       HH Arranged: NA HH Agency: NA        Social Determinants of Health (SDOH) Interventions SDOH Screenings   Food Insecurity: No Food Insecurity (01/23/2023)  Housing: Low Risk  (01/23/2023)  Transportation Needs: No Transportation Needs (01/23/2023)  Utilities: Not At Risk (01/23/2023)  Depression (PHQ2-9): Low Risk  (08/10/2022)  Tobacco Use: High Risk (01/24/2023)     Readmission Risk Interventions    01/25/2023    8:18 PM  Readmission Risk Prevention Plan  Transportation Screening Complete  Home Care Screening Complete  Medication Review (RN CM) Complete

## 2023-01-26 ENCOUNTER — Other Ambulatory Visit: Payer: Self-pay | Admitting: Family Medicine

## 2023-01-26 ENCOUNTER — Telehealth: Payer: Self-pay

## 2023-01-26 ENCOUNTER — Other Ambulatory Visit: Payer: Self-pay | Admitting: Internal Medicine

## 2023-01-26 ENCOUNTER — Other Ambulatory Visit (HOSPITAL_COMMUNITY): Payer: Self-pay

## 2023-01-26 LAB — ANTIPHOSPHOLIPID SYNDROME EVAL, BLD
Anticardiolipin IgA: <9 [APL'U]/mL (ref 0–11)
Anticardiolipin IgG: <9 [GPL'U]/mL (ref 0–14)
Anticardiolipin IgM: <9 [MPL'U]/mL (ref 0–12)
DRVVT: 33.6 s (ref 0.0–47.0)
PTT Lupus Anticoagulant: 47.9 s — ABNORMAL HIGH (ref 0.0–43.5)
Phosphatydalserine, IgA: <1 {APS'U} (ref 0–19)
Phosphatydalserine, IgG: 9 U (ref 0–30)
Phosphatydalserine, IgM: <10 U (ref 0–30)

## 2023-01-26 LAB — HEXAGONAL PHASE PHOSPHOLIPID: Hexagonal Phase Phospholipid: 3 s (ref 0–11)

## 2023-01-26 LAB — PTT-LA MIX: PTT-LA Mix: 43.6 s — ABNORMAL HIGH (ref 0.0–40.5)

## 2023-01-26 MED ORDER — APIXABAN 5 MG PO TABS: 5.0000 mg | ORAL_TABLET | Freq: Two times a day (BID) | ORAL | 1 refills | Status: AC

## 2023-01-26 NOTE — Telephone Encounter (Signed)
[facility] [address]

## 2023-01-26 NOTE — Transitions of Care (Post Inpatient/ED Visit) (Signed)
01/26/2023  Name: Hayden Moore MRN: [id number] DOB: 05-29-1990  Today's TOC FU Call Status: Today's TOC FU Call Status:: Successful TOC FU Call Completed TOC FU Call Complete Date: 01/26/23 Patient's Name and Date of Birth confirmed.  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 How have you been since you were released from the hospital?: Better (He said " this is all new to me.") Any questions or concerns?: No  Items Reviewed: Did you receive and understand the discharge instructions provided?: Yes Medications obtained,verified, and reconciled?: Yes (Medications Reviewed) (He has both medications and is taking them as instructed) Any new allergies since your discharge?: No Dietary orders reviewed?: Yes Type of Diet Ordered:: heart healthy low sodium Do you have support at home?: Yes Name of Support/Comfort Primary Source: He said he has family with him  Medications Reviewed Today: Medications Reviewed Today     Reviewed by Robyne Peers, RN (Case Manager) on 01/26/23 at 1312  Med List Status: <None>   Medication Order Taking? Sig Documenting Provider Last Dose Status Informant  apixaban (ELIQUIS) 5 MG TABS tablet [id number]  Take 1 tablet (5 mg total) by mouth 2 (two) times daily. Azucena Fallen, MD  Active   lisinopril (ZESTRIL) 10 MG tablet [id number] No TAKE 1 TABLET BY MOUTH EVERY DAY Rema Fendt, NP 01/21/2023 Active Self, Pharmacy Records            Home Care and Equipment/Supplies: Were Home Health Services Ordered?: No Any new equipment or medical supplies ordered?: No  Functional Questionnaire: Do you need assistance with bathing/showering or dressing?: No Do you need assistance with meal preparation?: No (family can assist if needed) Do you need assistance with  eating?: No Do you have difficulty maintaining continence: No Do you need assistance with getting out of bed/getting out of a chair/moving?: No Do you have difficulty managing or taking your medications?: No  Follow up appointments reviewed: PCP Follow-up appointment confirmed?: Yes Date of PCP follow-up appointment?: 02/14/23 Follow-up Provider: Dr Andrey Campanile [facility] Follow-up appointment confirmed?: No Reason Specialist Follow-Up Not Confirmed: Patient has Specialist Provider Number and will Call for Appointment (I emphasized the importance of following up with vascular and taking medications as ordered.  He said he understood and will call for the appointment) Do you need transportation to your follow-up appointment?: No Do you understand care options if your condition(s) worsen?: Yes-patient verbalized understanding  He has an appointment at [facility] pm 10/24 to establish care.  I asked if he was still planning to follow up with Dr Andrey Campanile because he does not need 2 PCPs.  He said he wants to keep seeing Dr Andrey Campanile. His mother had referred him to [employer]. He said he applied for Medicaid when he was in the hospital but has not heard if he has been approved.  I told him that if he is not approved, he can apply for CAFA/OC for assistance with medical bills,medications and will seeing specialist  I told him that he can get an application for CAFA from [facility] or he can call us and we can send him an application      SIGNATURE Robyne Peers, RN

## 2023-01-26 NOTE — Telephone Encounter (Signed)
Requested medication (s) are due for refill today: yes  Requested medication (s) are on the active medication list: yes  Last refill:  01/25/23  Future visit scheduled: yes  Notes to clinic:  [facility]     Requested Prescriptions  Pending Prescriptions Disp Refills   apixaban (ELIQUIS) 5 MG TABS tablet 60 tablet 1    Sig: Take 1 tablet (5 mg total) by mouth 2 (two) times daily.     Hematology:  Anticoagulants - apixaban Failed - 01/26/2023  8:51 AM      Failed - HGB in normal range and within 360 days    Hemoglobin  Date Value Ref Range Status  01/24/2023 12.7 (L) 13.0 - 17.0 g/dL Final         Failed - AST in normal range and within 360 days    AST  Date Value Ref Range Status  01/22/2023 53 (H) 15 - 41 U/L Final         Failed - ALT in normal range and within 360 days    ALT  Date Value Ref Range Status  01/22/2023 48 (H) 0 - 44 U/L Final         Passed - PLT in normal range and within 360 days    Platelets  Date Value Ref Range Status  01/24/2023 261 150 - 400 K/uL Final         Passed - HCT in normal range and within 360 days    HCT  Date Value Ref Range Status  01/24/2023 39.1 39.0 - 52.0 % Final         Passed - Cr in normal range and within 360 days    Creatinine, Ser  Date Value Ref Range Status  01/23/2023 1.17 0.61 - 1.24 mg/dL Final   Creatinine, Urine  Date Value Ref Range Status  01/23/2023 240 mg/dL Final         Passed - Valid encounter within last 12 months    Recent Outpatient Visits           5 months ago Essential hypertension   [facility], MD       Future Appointments             In 2 weeks Georganna Skeans, MD [facility]

## 2023-01-26 NOTE — Telephone Encounter (Signed)
Pt called and wants his Eliquis transferred from [facility] to   [id number] [place], [place] - [address]  [address] [place] [address]  Phone: [phone] Fax: [phone]   Was seen in the ED but wants his PCP to re-submit this to his preferred pharmacy because he says it should have never went to [facility].

## 2023-01-28 LAB — CULTURE, BLOOD (ROUTINE X 2)
Culture: NO GROWTH
Culture: NO GROWTH
Special Requests: ADEQUATE
Special Requests: ADEQUATE

## 2023-01-29 ENCOUNTER — Other Ambulatory Visit: Payer: Self-pay | Admitting: Family

## 2023-01-30 ENCOUNTER — Telehealth: Payer: Self-pay | Admitting: Family Medicine

## 2023-01-30 NOTE — Telephone Encounter (Signed)
Referral Request - Has patient seen PCP for this complaint? No. Was seen in hospital for this but has not seen PCP  *If NO, is insurance requiring patient see PCP for this issue before PCP can refer them? Patient just applied for medicaid but right now patient is self pay.  Referral for which specialty: Vascular & Vein   Preferred provider/office: [facility] in [facility] [address]  Reason for referral: Just had a blood clot removed in hospital and told him he needed to see a vein & vascular provider. Patient has HFU scheduled for 02/14/23  Patients callback  #  [phone]

## 2023-02-14 ENCOUNTER — Encounter: Payer: Self-pay | Admitting: Family Medicine

## 2023-02-14 ENCOUNTER — Ambulatory Visit (INDEPENDENT_AMBULATORY_CARE_PROVIDER_SITE_OTHER): Payer: Self-pay | Admitting: Family Medicine

## 2023-02-14 VITALS — BP 124/78 | HR 107 | Temp 98.9°F | Resp 16 | Ht 71.5 in | Wt 236.4 lb

## 2023-02-14 DIAGNOSIS — F17200 Nicotine dependence, unspecified, uncomplicated: Secondary | ICD-10-CM

## 2023-02-14 DIAGNOSIS — I10 Essential (primary) hypertension: Secondary | ICD-10-CM

## 2023-02-14 DIAGNOSIS — I742 Embolism and thrombosis of arteries of the upper extremities: Secondary | ICD-10-CM

## 2023-02-14 DIAGNOSIS — Z09 Encounter for follow-up examination after completed treatment for conditions other than malignant neoplasm: Secondary | ICD-10-CM

## 2023-02-14 NOTE — Progress Notes (Unsigned)
Established Patient Office Visit  Subjective    Patient ID: Hayden Moore, male    DOB: 1990/06/07  Age: 32 y.o. MRN: [id number]  CC:  Chief Complaint  Patient presents with   Follow-up    Surgery for blood clot in right arm    HPI [name] presents for hospital discharge follow up where he was admitted for arterial embolism of his right forearm. A filter was placed and he was placed on eliquis. He also is scheduled to follow up with a hematologist specialist. He denies acute complaints.   Outpatient Encounter Medications as of 02/14/2023  Medication Sig   apixaban (ELIQUIS) 5 MG TABS tablet Take 1 tablet (5 mg total) by mouth 2 (two) times daily.   lisinopril (ZESTRIL) 10 MG tablet TAKE 1 TABLET BY MOUTH EVERY DAY   No facility-administered encounter medications on file as of 02/14/2023.    Past Medical History:  Diagnosis Date   Arthritis    GERD (gastroesophageal reflux disease)    Hypertension     Past Surgical History:  Procedure Laterality Date   EMBOLECTOMY Right 01/23/2023   Procedure: ARM BRACHIAL EMBOLECTOMY;  Surgeon: Victorino Sparrow, MD;  Location: [facility] OR;  Service: Vascular;  Laterality: Right;    Family History  Family history unknown: Yes    Social History   Socioeconomic History   Marital status: Single    Spouse name: Not on file   Number of children: Not on file   Years of education: Not on file   Highest education level: Not on file  Occupational History   Not on file  Tobacco Use   Smoking status: Every Day    Current packs/day: 0.50    Types: Cigarettes   Smokeless tobacco: Never  Vaping Use   Vaping status: Never Used  Substance and Sexual Activity   Alcohol use: Yes   Drug use: No   Sexual activity: Not on file  Other Topics Concern   Not on file  Social History Narrative   Not on file   Social Determinants of Health   Financial Resource Strain: Low Risk  (02/14/2023)   Overall Financial Resource Strain (CARDIA)     Difficulty of Paying Living Expenses: Not hard at all  Food Insecurity: No Food Insecurity (01/23/2023)   Hunger Vital Sign    Worried About Running Out of Food in the Last Year: Never true    Ran Out of Food in the Last Year: Never true  Transportation Needs: No Transportation Needs (01/23/2023)   PRAPARE - Administrator, Civil Service (Medical): No    Lack of Transportation (Non-Medical): No  Physical Activity: Insufficiently Active (02/14/2023)   Exercise Vital Sign    Days of Exercise per Week: 2 days    Minutes of Exercise per Session: 30 min  Stress: No Stress Concern Present (02/14/2023)   [employer] of Occupational Health - Occupational Stress Questionnaire    Feeling of Stress : Not at all  Social Connections: Moderately Isolated (02/14/2023)   Social Connection and Isolation Panel [NHANES]    Frequency of Communication with Friends and Family: Twice a week    Frequency of Social Gatherings with Friends and Family: Twice a week    Attends Religious Services: Never    Database administrator or Organizations: No    Attends Banker Meetings: Never    Marital Status: Married  Catering manager Violence: Not At Risk (01/23/2023)   Humiliation, Afraid, Rape, and  Kick questionnaire    Fear of Current or Ex-Partner: No    Emotionally Abused: No    Physically Abused: No    Sexually Abused: No    Review of Systems  All other systems reviewed and are negative.       Objective    BP 124/78 (BP Location: Right Arm, Patient Position: Sitting, Cuff Size: Large)   Pulse (!) 107   Temp 98.9 F (37.2 C) (Oral)   Resp 16   Ht 5' 11.5" (1.816 m)   Wt 236 lb 6.4 oz (107.2 kg)   SpO2 96%   BMI 32.51 kg/m   Physical Exam Vitals and nursing note reviewed.  Constitutional:      General: He is not in acute distress. Cardiovascular:     Rate and Rhythm: Normal rate and regular rhythm.  Pulmonary:     Effort: Pulmonary effort is normal.     Breath  sounds: Normal breath sounds.  Abdominal:     Palpations: Abdomen is soft.     Tenderness: There is no abdominal tenderness.  Neurological:     General: No focal deficit present.     Mental Status: He is alert and oriented to person, place, and time.         Assessment & Plan:  1. Essential hypertension Appears stable. continue  2. Arterial embolism of upper extremity (HCC) Management as per consultant  3. Smoking Discussed reduction/cessation  4. Hospital discharge follow-up     No follow-ups on file.   [name], MD

## 2023-02-15 ENCOUNTER — Encounter: Payer: Self-pay | Admitting: Family Medicine

## 2023-02-16 ENCOUNTER — Ambulatory Visit: Payer: Self-pay | Admitting: Internal Medicine

## 2023-02-19 IMAGING — DX DG CHEST 1V PORT
1 series · 1 of 1 positions shown · non-contrast
Comparison: Chest radiographs 12/01/2020.

CLINICAL DATA: 30-year-old male with shortness of breath, right
chest pain radiating to the back. Positive for 2CMVJ-45.

EXAM:
PORTABLE CHEST 1 VIEW

[chest]
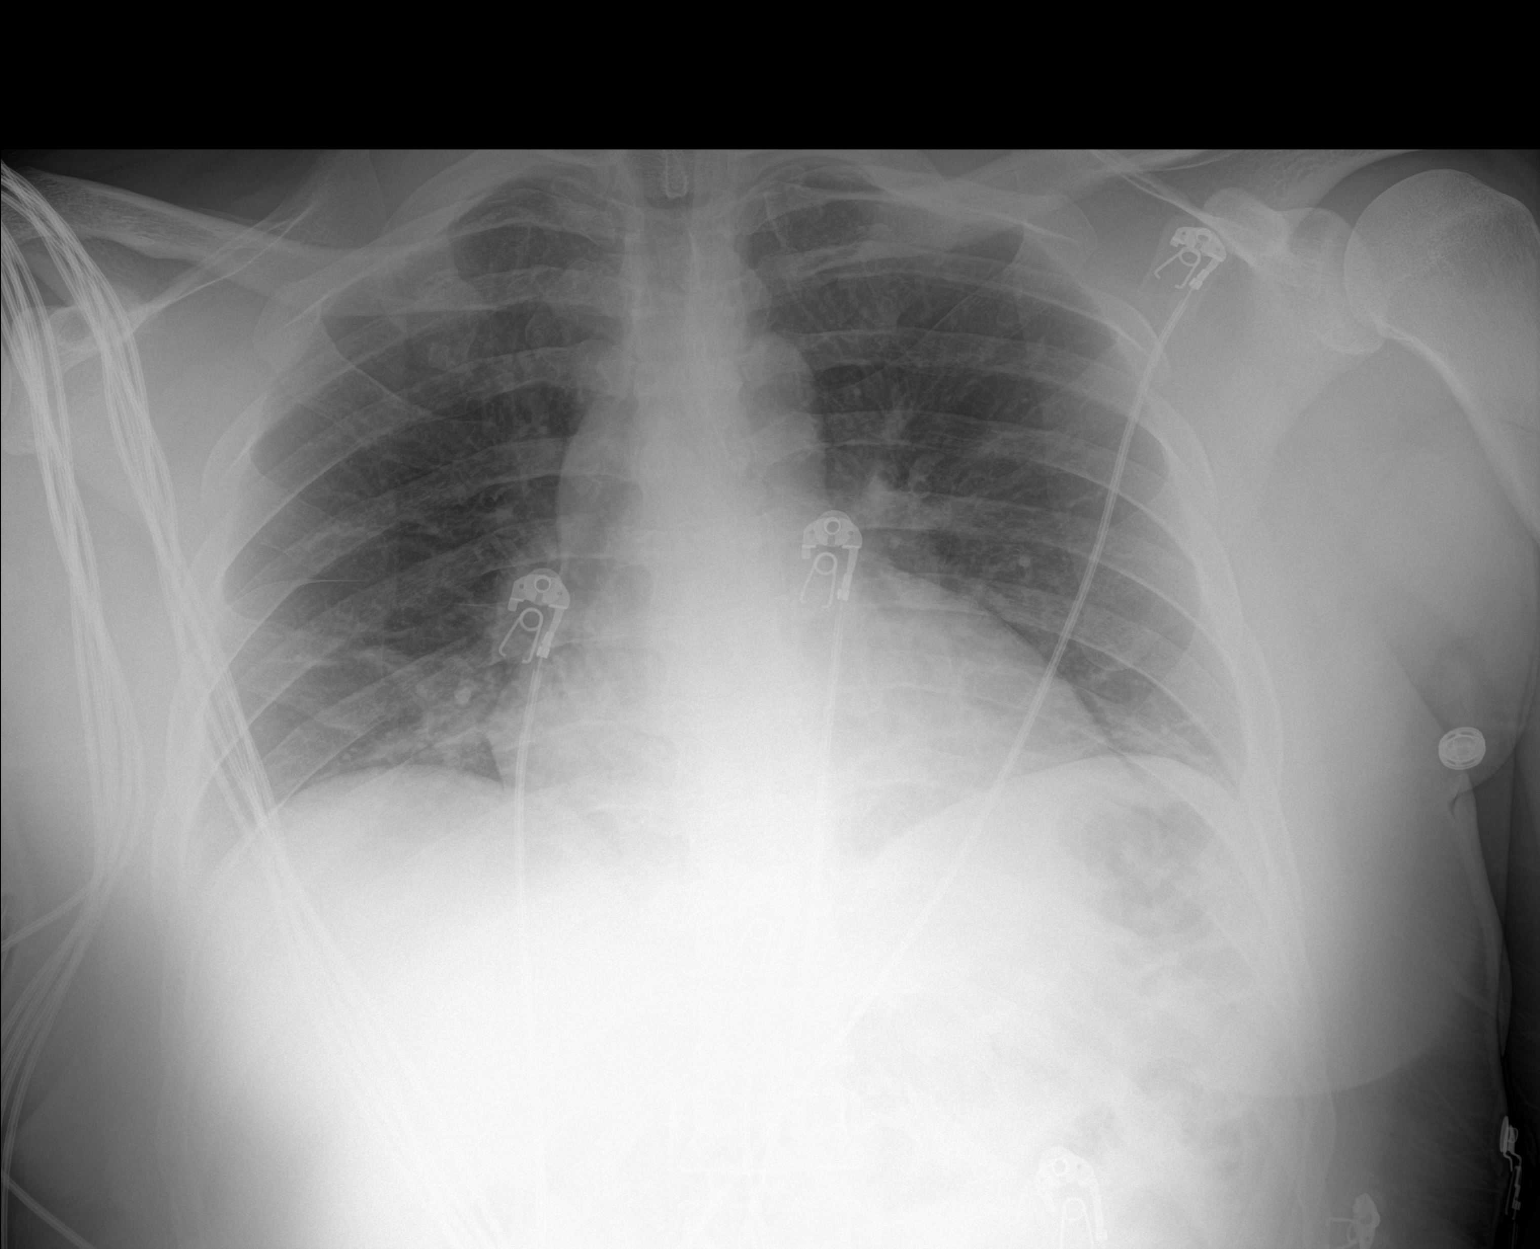

[1 of 1 positions shown; findings below may reference images not displayed]

FINDINGS: Portable AP semi upright view at 6834 hours. Lower lung volumes.
Mediastinal contours remain normal. Visualized tracheal air column
is within normal limits. Mild, patchy increased bibasilar opacity
since yesterday. No pneumothorax, pulmonary edema or pleural
effusion.

Negative visible bowel gas and osseous structures.
IMPRESSION: Lower lung volumes with patchy bibasilar atelectasis versus 2CMVJ-45
pneumonia.

## 2023-03-11 ENCOUNTER — Telehealth: Payer: Self-pay | Admitting: Hematology and Oncology

## 2023-03-11 NOTE — Telephone Encounter (Signed)
Spoke with patient confirming upcoming appointment  

## 2023-03-30 ENCOUNTER — Inpatient Hospital Stay: Payer: Self-pay | Attending: Hematology and Oncology | Admitting: Hematology and Oncology

## 2023-04-07 ENCOUNTER — Encounter: Payer: Self-pay | Admitting: *Deleted

## 2023-04-10 ENCOUNTER — Emergency Department (HOSPITAL_COMMUNITY): Payer: MEDICAID

## 2023-04-10 ENCOUNTER — Encounter (HOSPITAL_COMMUNITY): Payer: Self-pay

## 2023-04-10 ENCOUNTER — Other Ambulatory Visit: Payer: Self-pay

## 2023-04-10 ENCOUNTER — Emergency Department (HOSPITAL_COMMUNITY)
Admission: EM | Admit: 2023-04-10 | Discharge: 2023-04-11 | Discharge status: Against Medical Advice | Payer: MEDICAID | Attending: Emergency Medicine | Admitting: Emergency Medicine

## 2023-04-10 DIAGNOSIS — N50812 Left testicular pain: Secondary | ICD-10-CM | POA: Insufficient documentation

## 2023-04-10 DIAGNOSIS — Z5321 Procedure and treatment not carried out due to patient leaving prior to being seen by health care provider: Secondary | ICD-10-CM | POA: Insufficient documentation

## 2023-04-10 LAB — COMPREHENSIVE METABOLIC PANEL
ALT: 75 U/L — ABNORMAL HIGH (ref 0–44)
AST: 78 U/L — ABNORMAL HIGH (ref 15–41)
Albumin: 3.3 g/dL — ABNORMAL LOW (ref 3.5–5.0)
Alkaline Phosphatase: 210 U/L — ABNORMAL HIGH (ref 38–126)
Anion gap: 9 (ref 5–15)
BUN: 5 mg/dL — ABNORMAL LOW (ref 6–20)
CO2: 24 mmol/L (ref 22–32)
Calcium: 9.4 mg/dL (ref 8.9–10.3)
Chloride: 100 mmol/L (ref 98–111)
Creatinine, Ser: 1.2 mg/dL (ref 0.61–1.24)
GFR, Estimated: >60 mL/min (ref >60)
Glucose, Bld: 100 mg/dL — ABNORMAL HIGH (ref 70–99)
Potassium: 3.6 mmol/L (ref 3.5–5.1)
Sodium: 133 mmol/L — ABNORMAL LOW (ref 135–145)
Total Bilirubin: 0.9 mg/dL (ref <1.2)
Total Protein: 7.5 g/dL (ref 6.5–8.1)

## 2023-04-10 LAB — CBC
HCT: 44.9 % (ref 39.0–52.0)
Hemoglobin: 14.6 g/dL (ref 13.0–17.0)
MCH: 29 pg (ref 26.0–34.0)
MCHC: 32.5 g/dL (ref 30.0–36.0)
MCV: 89.1 fL (ref 80.0–100.0)
Platelets: 302 10*3/uL (ref 150–400)
RBC: 5.04 MIL/uL (ref 4.22–5.81)
RDW: 13.8 % (ref 11.5–15.5)
WBC: 6.1 10*3/uL (ref 4.0–10.5)
nRBC: 0 % (ref 0.0–0.2)

## 2023-04-10 NOTE — ED Provider Triage Note (Signed)
Emergency Medicine Provider Triage Evaluation Note  Hayden Moore , a 32 y.o. male  was evaluated in triage.  Pt complains of testicle pain.  Patient states he was here about a month ago and had the same he has worsening swelling in his left testicle now radiating to his left flank.  He reports that he had testing for STIs and was treated prophylactically without a positive GC or chlamydia.  He states the swelling in his testicles worsening..  Review of Systems  Positive: Testicle swelling and pain Negative: Fever or urinary symptoms  Physical Exam  BP 135/89   Pulse (!) 105   Temp 99.5 F (37.5 C) (Oral)   Resp 16   Ht 5' 11.5" (1.816 m)   Wt 107.5 kg   SpO2 96%   BMI 32.59 kg/m  Gen:   Awake, no distress   Resp:  Normal effort  MSK:   Moves extremities without difficulty  Other:    Medical Decision Making  Medically screening exam initiated at 5:27 PM.  Appropriate orders placed.  Hayden Moore was informed that the remainder of the evaluation will be completed by another provider, this initial triage assessment does not replace that evaluation, and the importance of remaining in the ED until their evaluation is complete.     Hayden Captain, PA-C 04/10/23 1730

## 2023-04-10 NOTE — ED Triage Notes (Signed)
Pt c.o testicle swelling and cold to touch since yesterday with left sided abd pain.

## 2023-04-11 NOTE — ED Notes (Signed)
Pt called for vitals and ua. No response x2

## 2023-04-11 NOTE — ED Notes (Signed)
Pt called several times for vitals.

## 2023-05-17 ENCOUNTER — Encounter: Payer: Self-pay | Admitting: Family Medicine

## 2023-05-17 ENCOUNTER — Ambulatory Visit: Payer: Self-pay | Admitting: Family Medicine

## 2023-09-14 ENCOUNTER — Other Ambulatory Visit: Payer: Self-pay

## 2023-09-14 ENCOUNTER — Emergency Department (HOSPITAL_COMMUNITY): Payer: Self-pay

## 2023-09-14 ENCOUNTER — Encounter (HOSPITAL_COMMUNITY): Payer: Self-pay

## 2023-09-14 ENCOUNTER — Emergency Department (HOSPITAL_COMMUNITY)
Admission: EM | Admit: 2023-09-14 | Discharge: 2023-09-14 | Disposition: A | Discharge status: Home / Self Care | Payer: Self-pay | Attending: Emergency Medicine | Admitting: Emergency Medicine

## 2023-09-14 DIAGNOSIS — M79605 Pain in left leg: Secondary | ICD-10-CM | POA: Insufficient documentation

## 2023-09-14 DIAGNOSIS — Z7901 Long term (current) use of anticoagulants: Secondary | ICD-10-CM | POA: Insufficient documentation

## 2023-09-14 MED ORDER — IBUPROFEN 800 MG PO TABS: 800.0000 mg | ORAL_TABLET | Freq: Three times a day (TID) | ORAL | 0 refills | Status: DC | PRN

## 2023-09-14 NOTE — ED Triage Notes (Signed)
 Pt states he randomly sprinted from the trash can and hurt his knee/leg. Hurts to put weight on it and walk.

## 2023-09-14 NOTE — Discharge Instructions (Signed)
 Follow-up with [facility] in 1 to 2 weeks for recheck of your leg

## 2023-09-14 NOTE — ED Provider Notes (Signed)
 [place] EMERGENCY DEPARTMENT AT [facility] Provider Note   CSN: [id number] Arrival date & time: 09/14/23  1019     History  Chief Complaint  Patient presents with   Leg Pain    Hayden Moore is a 33 y.o. male.  Patient injured his left calf while he was running.  Patient complains of pain to the superior part of his left calf  The history is provided by the patient and medical records. No language interpreter was used.  Leg Pain Location:  Leg Leg location:  L leg Pain details:    Quality:  Aching   Radiates to:  Does not radiate   Severity:  Moderate   Onset quality:  Sudden   Timing:  Constant   Progression:  Waxing and waning Chronicity:  New Dislocation: no   Associated symptoms: no back pain and no fatigue        Home Medications Prior to Admission medications   Medication Sig Start Date End Date Taking? Authorizing Provider  ibuprofen  (ADVIL ) 800 MG tablet Take 1 tablet (800 mg total) by mouth every 8 (eight) hours as needed for moderate pain (pain score 4-6). 09/14/23  Yes Cheyenne Cotta, MD  apixaban  (ELIQUIS ) 5 MG TABS tablet Take 1 tablet (5 mg total) by mouth 2 (two) times daily. 01/26/23   Haydee Lipa, MD  lisinopril  (ZESTRIL ) 10 MG tablet TAKE 1 TABLET BY MOUTH EVERY DAY 01/30/23   Senaida Dama, NP      Allergies    Patient has no known allergies.    Review of Systems   Review of Systems  Constitutional:  Negative for appetite change and fatigue.  HENT:  Negative for congestion, ear discharge and sinus pressure.   Eyes:  Negative for discharge.  Respiratory:  Negative for cough.   Cardiovascular:  Negative for chest pain.  Gastrointestinal:  Negative for abdominal pain and diarrhea.  Genitourinary:  Negative for frequency and hematuria.  Musculoskeletal:  Negative for back pain.       Leg pain  Skin:  Negative for rash.  Neurological:  Negative for seizures and headaches.  Psychiatric/Behavioral:  Negative for  hallucinations.     Physical Exam Updated Vital Signs BP 101/72 (BP Location: Left Arm)   Pulse (!) 101   Temp 98.2 F (36.8 C) (Oral)   Resp 16   SpO2 96%  Physical Exam Vitals and nursing note reviewed.  Constitutional:      Appearance: He is well-developed.  HENT:     Head: Normocephalic.     Nose: Nose normal.  Eyes:     General: No scleral icterus.    Conjunctiva/sclera: Conjunctivae normal.  Neck:     Thyroid: No thyromegaly.  Cardiovascular:     Rate and Rhythm: Normal rate and regular rhythm.     Heart sounds: No murmur heard.    No friction rub. No gallop.  Pulmonary:     Breath sounds: No stridor. No wheezing or rales.  Chest:     Chest wall: No tenderness.  Abdominal:     General: There is no distension.     Tenderness: There is no abdominal tenderness. There is no rebound.  Musculoskeletal:     Cervical back: Neck supple.     Comments: Patient has tenderness to the superior part of the gastrocnemius muscle on the left side.  Lymphadenopathy:     Cervical: No cervical adenopathy.  Skin:    Findings: No erythema or rash.  Neurological:  Mental Status: He is alert and oriented to person, place, and time.     Motor: No abnormal muscle tone.     Coordination: Coordination normal.  Psychiatric:        Behavior: Behavior normal.     ED Results / Procedures / Treatments   Labs (all labs ordered are listed, but only abnormal results are displayed) Labs Reviewed - No data to display  EKG None  Radiology DG Tibia/Fibula Left Result Date: 09/14/2023 CLINICAL DATA:  Left leg pain after injury. EXAM: LEFT TIBIA AND FIBULA - 2 VIEW COMPARISON:  October 12, 2022. FINDINGS: There is no evidence of fracture or other focal bone lesions. Soft tissues are unremarkable. IMPRESSION: Negative. Electronically Signed   By: Rosalene Colon M.D.   On: 09/14/2023 12:46    Procedures Procedures    Medications Ordered in ED Medications - No data to display  ED  Course/ Medical Decision Making/ A&P                                 Medical Decision Making Amount and/or Complexity of Data Reviewed Radiology: ordered.  Risk Prescription drug management.  X-ray reviewed and unremarkable Patient with muscle strain in the left leg.  Possible ligamental strain.  He has been on Motrin  and will follow-up with Ortho        Final Clinical Impression(s) / ED Diagnoses Final diagnoses:  Left leg pain    Rx / DC Orders ED Discharge Orders          Ordered    ibuprofen  (ADVIL ) 800 MG tablet  Every 8 hours PRN        09/14/23 1311              Cheyenne Cotta, MD 09/15/23 1209

## 2024-03-01 ENCOUNTER — Emergency Department (HOSPITAL_COMMUNITY)

## 2024-03-01 ENCOUNTER — Emergency Department (HOSPITAL_COMMUNITY): Admission: EM | Admit: 2024-03-01 | Discharge: 2024-03-02 | Disposition: A | Discharge status: Home / Self Care

## 2024-03-01 DIAGNOSIS — S62315A Displaced fracture of base of fourth metacarpal bone, left hand, initial encounter for closed fracture: Secondary | ICD-10-CM | POA: Insufficient documentation

## 2024-03-01 DIAGNOSIS — R739 Hyperglycemia, unspecified: Secondary | ICD-10-CM | POA: Diagnosis not present

## 2024-03-01 DIAGNOSIS — Y92410 Unspecified street and highway as the place of occurrence of the external cause: Secondary | ICD-10-CM | POA: Insufficient documentation

## 2024-03-01 DIAGNOSIS — M545 Low back pain, unspecified: Secondary | ICD-10-CM | POA: Diagnosis not present

## 2024-03-01 DIAGNOSIS — D72829 Elevated white blood cell count, unspecified: Secondary | ICD-10-CM | POA: Diagnosis not present

## 2024-03-01 DIAGNOSIS — Z7901 Long term (current) use of anticoagulants: Secondary | ICD-10-CM | POA: Insufficient documentation

## 2024-03-01 DIAGNOSIS — M79642 Pain in left hand: Secondary | ICD-10-CM | POA: Diagnosis present

## 2024-03-01 DIAGNOSIS — M62838 Other muscle spasm: Secondary | ICD-10-CM | POA: Diagnosis not present

## 2024-03-01 DIAGNOSIS — Z86718 Personal history of other venous thrombosis and embolism: Secondary | ICD-10-CM | POA: Diagnosis not present

## 2024-03-01 DIAGNOSIS — R0789 Other chest pain: Secondary | ICD-10-CM | POA: Diagnosis present

## 2024-03-01 LAB — I-STAT CHEM 8, ED
BUN: 5 mg/dL — ABNORMAL LOW (ref 6–20)
Calcium, Ion: 1.05 mmol/L — ABNORMAL LOW (ref 1.15–1.40)
Chloride: 107 mmol/L (ref 98–111)
Creatinine, Ser: 1.5 mg/dL — ABNORMAL HIGH (ref 0.61–1.24)
Glucose, Bld: 160 mg/dL — ABNORMAL HIGH (ref 70–99)
HCT: 48 % (ref 39.0–52.0)
Hemoglobin: 16.3 g/dL (ref 13.0–17.0)
Potassium: 3.7 mmol/L (ref 3.5–5.1)
Sodium: 143 mmol/L (ref 135–145)
TCO2: 21 mmol/L — ABNORMAL LOW (ref 22–32)

## 2024-03-01 LAB — CBC
HCT: 47.1 % (ref 39.0–52.0)
Hemoglobin: 15.9 g/dL (ref 13.0–17.0)
MCH: 30.9 pg (ref 26.0–34.0)
MCHC: 33.8 g/dL (ref 30.0–36.0)
MCV: 91.5 fL (ref 80.0–100.0)
Platelets: 307 K/uL (ref 150–400)
RBC: 5.15 MIL/uL (ref 4.22–5.81)
RDW: 12.7 % (ref 11.5–15.5)
WBC: 10.7 K/uL — ABNORMAL HIGH (ref 4.0–10.5)
nRBC: 0 % (ref 0.0–0.2)

## 2024-03-01 LAB — COMPREHENSIVE METABOLIC PANEL WITH GFR
ALT: 69 U/L — ABNORMAL HIGH (ref 0–44)
AST: 93 U/L — ABNORMAL HIGH (ref 15–41)
Albumin: 4 g/dL (ref 3.5–5.0)
Alkaline Phosphatase: 109 U/L (ref 38–126)
Anion gap: 14 (ref 5–15)
BUN: 5 mg/dL — ABNORMAL LOW (ref 6–20)
CO2: 18 mmol/L — ABNORMAL LOW (ref 22–32)
Calcium: 8.5 mg/dL — ABNORMAL LOW (ref 8.9–10.3)
Chloride: 106 mmol/L (ref 98–111)
Creatinine, Ser: 1.24 mg/dL (ref 0.61–1.24)
GFR, Estimated: >60 mL/min (ref >60)
Glucose, Bld: 172 mg/dL — ABNORMAL HIGH (ref 70–99)
Potassium: 3.5 mmol/L (ref 3.5–5.1)
Sodium: 138 mmol/L (ref 135–145)
Total Bilirubin: 0.7 mg/dL (ref 0.0–1.2)
Total Protein: 8.4 g/dL — ABNORMAL HIGH (ref 6.5–8.1)

## 2024-03-01 LAB — PROTIME-INR
INR: 1 (ref 0.8–1.2)
Prothrombin Time: 13.6 s (ref 11.4–15.2)

## 2024-03-01 LAB — I-STAT CG4 LACTIC ACID, ED: Lactic Acid, Venous: 2.7 mmol/L (ref 0.5–1.9)

## 2024-03-01 LAB — SAMPLE TO BLOOD BANK

## 2024-03-01 LAB — ETHANOL: Alcohol, Ethyl (B): 301 mg/dL (ref <15)

## 2024-03-01 MED ORDER — KETOROLAC TROMETHAMINE 15 MG/ML IJ SOLN
15.0000 mg | Freq: Once | INTRAMUSCULAR | Status: AC
  Administered 2024-03-01: 15 mg via INTRAVENOUS
  Filled 2024-03-01: qty 1

## 2024-03-01 NOTE — Discharge Instructions (Addendum)
 Please follow up with hand surgery. You need to follow up with them in the next week. You may need surgery given this hand fracture.

## 2024-03-01 NOTE — ED Notes (Signed)
 Mother Bascom [phone] would like an update asap
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
## 2024-03-01 NOTE — ED Notes (Signed)
 Received critical Ethanol level of 301 Reported to T. Simon MD
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
## 2024-03-02 ENCOUNTER — Emergency Department (HOSPITAL_COMMUNITY)
Admission: EM | Admit: 2024-03-02 | Discharge: 2024-03-02 | Disposition: A | Discharge status: Home / Self Care | Payer: Self-pay | Source: Home / Self Care | Attending: Emergency Medicine | Admitting: Emergency Medicine

## 2024-03-02 ENCOUNTER — Encounter (HOSPITAL_COMMUNITY): Payer: Self-pay

## 2024-03-02 ENCOUNTER — Emergency Department (HOSPITAL_COMMUNITY): Payer: Self-pay

## 2024-03-02 ENCOUNTER — Other Ambulatory Visit: Payer: Self-pay

## 2024-03-02 DIAGNOSIS — T148XXA Other injury of unspecified body region, initial encounter: Secondary | ICD-10-CM

## 2024-03-02 DIAGNOSIS — Z7901 Long term (current) use of anticoagulants: Secondary | ICD-10-CM | POA: Insufficient documentation

## 2024-03-02 DIAGNOSIS — R739 Hyperglycemia, unspecified: Secondary | ICD-10-CM | POA: Insufficient documentation

## 2024-03-02 DIAGNOSIS — Y92410 Unspecified street and highway as the place of occurrence of the external cause: Secondary | ICD-10-CM | POA: Insufficient documentation

## 2024-03-02 DIAGNOSIS — R7989 Other specified abnormal findings of blood chemistry: Secondary | ICD-10-CM

## 2024-03-02 DIAGNOSIS — D72829 Elevated white blood cell count, unspecified: Secondary | ICD-10-CM | POA: Insufficient documentation

## 2024-03-02 DIAGNOSIS — M62838 Other muscle spasm: Secondary | ICD-10-CM | POA: Insufficient documentation

## 2024-03-02 DIAGNOSIS — R0789 Other chest pain: Secondary | ICD-10-CM | POA: Insufficient documentation

## 2024-03-02 LAB — CBC
HCT: 45 % (ref 39.0–52.0)
Hemoglobin: 14.7 g/dL (ref 13.0–17.0)
MCH: 29.9 pg (ref 26.0–34.0)
MCHC: 32.7 g/dL (ref 30.0–36.0)
MCV: 91.6 fL (ref 80.0–100.0)
Platelets: 252 K/uL (ref 150–400)
RBC: 4.91 MIL/uL (ref 4.22–5.81)
RDW: 12.9 % (ref 11.5–15.5)
WBC: 10.8 K/uL — ABNORMAL HIGH (ref 4.0–10.5)
nRBC: 0 % (ref 0.0–0.2)

## 2024-03-02 LAB — BASIC METABOLIC PANEL WITH GFR
Anion gap: 11 (ref 5–15)
BUN: 8 mg/dL (ref 6–20)
CO2: 23 mmol/L (ref 22–32)
Calcium: 9.3 mg/dL (ref 8.9–10.3)
Chloride: 104 mmol/L (ref 98–111)
Creatinine, Ser: 1.13 mg/dL (ref 0.61–1.24)
GFR, Estimated: >60 mL/min (ref >60)
Glucose, Bld: 112 mg/dL — ABNORMAL HIGH (ref 70–99)
Potassium: 4.4 mmol/L (ref 3.5–5.1)
Sodium: 138 mmol/L (ref 135–145)

## 2024-03-02 LAB — TROPONIN T, HIGH SENSITIVITY
Troponin T High Sensitivity: 22 ng/L — ABNORMAL HIGH (ref 0–19)
Troponin T High Sensitivity: 20 ng/L — ABNORMAL HIGH (ref 0–19)

## 2024-03-02 MED ORDER — KETOROLAC TROMETHAMINE 30 MG/ML IJ SOLN
15.0000 mg | Freq: Once | INTRAMUSCULAR | Status: AC
  Administered 2024-03-02: 15 mg via INTRAVENOUS
  Filled 2024-03-02: qty 1

## 2024-03-02 MED ORDER — OXYCODONE HCL 5 MG PO TABS: 5.0000 mg | ORAL_TABLET | Freq: Four times a day (QID) | ORAL | 0 refills | Status: DC | PRN

## 2024-03-02 MED ORDER — MORPHINE SULFATE (PF) 4 MG/ML IV SOLN
4.0000 mg | Freq: Once | INTRAVENOUS | Status: AC
  Administered 2024-03-02: 4 mg via INTRAVENOUS
  Filled 2024-03-02: qty 1

## 2024-03-02 MED ORDER — METHOCARBAMOL 500 MG PO TABS: 500.0000 mg | ORAL_TABLET | Freq: Two times a day (BID) | ORAL | 0 refills | Status: AC

## 2024-03-02 MED ORDER — IOHEXOL 350 MG/ML SOLN
75.0000 mL | Freq: Once | INTRAVENOUS | Status: AC | PRN
  Administered 2024-03-02: 75 mL via INTRAVENOUS

## 2024-03-02 MED ORDER — METHOCARBAMOL 500 MG PO TABS
500.0000 mg | ORAL_TABLET | Freq: Once | ORAL | Status: AC
  Administered 2024-03-02: 500 mg via ORAL
  Filled 2024-03-02: qty 1

## 2024-03-02 NOTE — ED Notes (Signed)
 Spoke to pt mother Glenys with an update

## 2024-03-02 NOTE — ED Provider Notes (Signed)
 EKG not carryover into MUSE today.  The patient's EKG from today is without significant change compared to yesterday yesterday had some sinus tachycardia today at a heart rate of 69 with a little bit of sinus arrhythmia.   Hayden Pohle, MD 03/02/24 1429

## 2024-03-02 NOTE — Discharge Instructions (Addendum)
 As discussed, please follow-up with hand surgery and cardiology.  Seek emergency care experiencing any new or worsening symptoms.  Alternating between 650 mg Tylenol  and 400 mg Advil : The best way to alternate taking Acetaminophen  (example Tylenol ) and Ibuprofen  (example Advil /Motrin ) is to take them 3 hours apart. For example, if you take ibuprofen  at 6 am you can then take Tylenol  at 9 am. You can continue this regimen throughout the day, making sure you do not exceed the recommended maximum dose for each drug.
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
## 2024-03-02 NOTE — Progress Notes (Signed)
 Orthopedic Tech Progress Note Patient Details:  Hayden Moore Jul 04, 1990 [id number]  Ortho Devices Type of Ortho Device: Ulna gutter splint Ortho Device/Splint Location: L 4TH METACARPAL Ortho Device/Splint Interventions: Ordered, Application, Adjustment   Post Interventions Patient Tolerated: Well Instructions Provided: Care of device  Hayden Moore [name] 03/02/2024, 12:17 AM
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
## 2024-03-02 NOTE — ED Notes (Signed)
 Spoke to Wife Rosina gave updated will call back with D/c plan
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
## 2024-03-06 ENCOUNTER — Inpatient Hospital Stay: Admitting: Family Medicine

## 2024-03-10 NOTE — Progress Notes (Deleted)
   Hayden Moore - 33 y.o. male MRN [id number]  Date of birth: 06-Jul-1990  Office Visit Note: Visit Date: 03/11/2024 PCP: Tanda Bleacher, MD Referred by: Tanda Bleacher, MD  Subjective: No chief complaint on file.  HPI: Hayden Moore is a pleasant 33 y.o. male who presents today for ***  Pertinent ROS were reviewed with the patient and found to be negative unless otherwise specified above in HPI.   Visit Reason: Duration of symptoms: Hand dominance: {RIGHT/LEFT:20294} Occupation: Diabetic: {yes/no:20286} Smoking: {yes/no:20286} Heart/Lung History: Blood Thinners:   Prior Testing/EMG: Injections (Date): Treatments: Prior Surgery:    Assessment & Plan: Visit Diagnoses: No diagnosis found.  Plan: ***  Follow-up: No follow-ups on file.   Meds & Orders: No orders of the defined types were placed in this encounter.  No orders of the defined types were placed in this encounter.    Procedures: No procedures performed      Clinical History: No specialty comments available.  He reports that he has been smoking cigarettes. He has never used smokeless tobacco. No results for input(s): HGBA1C, LABURIC in the last 8760 hours.  Objective:   Vital Signs: There were no vitals taken for this visit.  Physical Exam  Gen: Well-appearing, in no acute distress; non-toxic CV: Regular Rate. Well-perfused. Warm.  Resp: Breathing unlabored on room air; no wheezing. Psych: Fluid speech in conversation; appropriate affect; normal thought process  Ortho Exam - ***   Imaging: No results found.  Past Medical/Family/Surgical/Social History: Medications & Allergies reviewed per EMR, new medications updated. 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   TOBACCO USER 03/17/2009   Dermatophytosis of body 03/16/2009   PENILE PAIN 02/10/2009   OBESITY 04/09/2008   TINEA VERSICOLOR 02/12/2007   HYPERTENSION, BENIGN  SYSTEMIC 06/22/2006   Past Medical History:  Diagnosis Date   Arthritis    GERD (gastroesophageal reflux disease)    Hypertension    Family History  Family history unknown: Yes   Past Surgical History:  Procedure Laterality Date   EMBOLECTOMY Right 01/23/2023   Procedure: ARM BRACHIAL EMBOLECTOMY;  Surgeon: Lanis Fonda BRAVO, MD;  Location: [facility] OR;  Service: Vascular;  Laterality: Right;   Social History   Occupational History   Not on file  Tobacco Use   Smoking status: Every Day    Current packs/day: 0.50    Types: Cigarettes   Smokeless tobacco: Never  Vaping Use   Vaping status: Never Used  Substance and Sexual Activity   Alcohol use: Yes   Drug use: No   Sexual activity: Not on file    Durante Violett Afton Alderton, M.D.  [facility], [facility] Surgery

## 2024-03-11 ENCOUNTER — Ambulatory Visit: Admitting: Orthopedic Surgery

## 2024-03-12 ENCOUNTER — Other Ambulatory Visit (INDEPENDENT_AMBULATORY_CARE_PROVIDER_SITE_OTHER): Payer: Self-pay

## 2024-03-12 ENCOUNTER — Ambulatory Visit: Admitting: Orthopedic Surgery

## 2024-03-12 DIAGNOSIS — S6292XA Unspecified fracture of left wrist and hand, initial encounter for closed fracture: Secondary | ICD-10-CM
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
## 2024-03-13 NOTE — Progress Notes (Signed)
 Reviewed recent ED note with Dr. Paul. Ok to proceed with surgery 11/21

## 2024-03-14 ENCOUNTER — Encounter (HOSPITAL_BASED_OUTPATIENT_CLINIC_OR_DEPARTMENT_OTHER): Payer: Self-pay | Admitting: Orthopedic Surgery

## 2024-03-14 ENCOUNTER — Other Ambulatory Visit: Payer: Self-pay

## 2024-03-15 ENCOUNTER — Encounter (HOSPITAL_BASED_OUTPATIENT_CLINIC_OR_DEPARTMENT_OTHER): Payer: Self-pay | Admitting: Orthopedic Surgery

## 2024-03-15 ENCOUNTER — Encounter (HOSPITAL_BASED_OUTPATIENT_CLINIC_OR_DEPARTMENT_OTHER)
Admission: RE | Disposition: A | Discharge status: Home / Self Care | Payer: Self-pay | Source: Home / Self Care | Attending: Orthopedic Surgery

## 2024-03-15 ENCOUNTER — Ambulatory Visit (HOSPITAL_BASED_OUTPATIENT_CLINIC_OR_DEPARTMENT_OTHER)

## 2024-03-15 ENCOUNTER — Telehealth: Payer: Self-pay | Admitting: Orthopedic Surgery

## 2024-03-15 ENCOUNTER — Ambulatory Visit (HOSPITAL_BASED_OUTPATIENT_CLINIC_OR_DEPARTMENT_OTHER)
Admission: RE | Admit: 2024-03-15 | Discharge: 2024-03-15 | Disposition: A | Discharge status: Home / Self Care | Attending: Orthopedic Surgery | Admitting: Orthopedic Surgery

## 2024-03-15 DIAGNOSIS — K219 Gastro-esophageal reflux disease without esophagitis: Secondary | ICD-10-CM | POA: Insufficient documentation

## 2024-03-15 DIAGNOSIS — S62305A Unspecified fracture of fourth metacarpal bone, left hand, initial encounter for closed fracture: Secondary | ICD-10-CM | POA: Insufficient documentation

## 2024-03-15 DIAGNOSIS — F17210 Nicotine dependence, cigarettes, uncomplicated: Secondary | ICD-10-CM | POA: Insufficient documentation

## 2024-03-15 DIAGNOSIS — S6292XA Unspecified fracture of left wrist and hand, initial encounter for closed fracture: Secondary | ICD-10-CM

## 2024-03-15 DIAGNOSIS — M1990 Unspecified osteoarthritis, unspecified site: Secondary | ICD-10-CM | POA: Insufficient documentation

## 2024-03-15 DIAGNOSIS — I10 Essential (primary) hypertension: Secondary | ICD-10-CM | POA: Insufficient documentation

## 2024-03-15 HISTORY — PX: OPEN REDUCTION INTERNAL FIXATION (ORIF) METACARPAL: SHX6234

## 2024-03-15 SURGERY — OPEN REDUCTION INTERNAL FIXATION (ORIF) METACARPAL
Anesthesia: General | Site: Hand | Laterality: Left

## 2024-03-15 MED ORDER — LIDOCAINE 2% (20 MG/ML) 5 ML SYRINGE
INTRAMUSCULAR | Status: DC | PRN
  Administered 2024-03-15: 80 mg via INTRAVENOUS

## 2024-03-15 MED ORDER — FENTANYL CITRATE (PF) 100 MCG/2ML IJ SOLN
INTRAMUSCULAR | Status: AC
  Filled 2024-03-15: qty 2

## 2024-03-15 MED ORDER — KETOROLAC TROMETHAMINE 30 MG/ML IJ SOLN
INTRAMUSCULAR | Status: DC | PRN
  Administered 2024-03-15: 30 mg via INTRAVENOUS

## 2024-03-15 MED ORDER — PROPOFOL 10 MG/ML IV BOLUS
INTRAVENOUS | Status: DC | PRN
  Administered 2024-03-15: 200 mg via INTRAVENOUS
  Administered 2024-03-15: 50 mg via INTRAVENOUS

## 2024-03-15 MED ORDER — ACETAMINOPHEN 10 MG/ML IV SOLN
INTRAVENOUS | Status: AC
  Filled 2024-03-15: qty 100

## 2024-03-15 MED ORDER — DROPERIDOL 2.5 MG/ML IJ SOLN: 0.6250 mg | Freq: Once | INTRAMUSCULAR | Status: DC | PRN

## 2024-03-15 MED ORDER — GLYCOPYRROLATE PF 0.2 MG/ML IJ SOSY
PREFILLED_SYRINGE | INTRAMUSCULAR | Status: AC
  Filled 2024-03-15: qty 1

## 2024-03-15 MED ORDER — OXYCODONE HCL 5 MG/5ML PO SOLN: 5.0000 mg | Freq: Once | ORAL | Status: DC | PRN

## 2024-03-15 MED ORDER — LIDOCAINE 2% (20 MG/ML) 5 ML SYRINGE
INTRAMUSCULAR | Status: AC
  Filled 2024-03-15: qty 5

## 2024-03-15 MED ORDER — MIDAZOLAM HCL 2 MG/2ML IJ SOLN
INTRAMUSCULAR | Status: AC
  Filled 2024-03-15: qty 2

## 2024-03-15 MED ORDER — CEFAZOLIN SODIUM-DEXTROSE 2-4 GM/100ML-% IV SOLN
INTRAVENOUS | Status: AC
  Filled 2024-03-15: qty 100

## 2024-03-15 MED ORDER — CEFAZOLIN SODIUM-DEXTROSE 2-4 GM/100ML-% IV SOLN
2.0000 g | INTRAVENOUS | Status: AC
  Administered 2024-03-15: 2 g via INTRAVENOUS

## 2024-03-15 MED ORDER — OXYCODONE HCL 5 MG PO TABS: 5.0000 mg | ORAL_TABLET | Freq: Once | ORAL | Status: DC | PRN

## 2024-03-15 MED ORDER — HYDROMORPHONE HCL 1 MG/ML IJ SOLN
INTRAMUSCULAR | Status: AC
  Filled 2024-03-15: qty 0.5

## 2024-03-15 MED ORDER — DEXMEDETOMIDINE HCL IN NACL 80 MCG/20ML IV SOLN
INTRAVENOUS | Status: DC | PRN
  Administered 2024-03-15: 12 ug via INTRAVENOUS

## 2024-03-15 MED ORDER — ONDANSETRON HCL 4 MG/2ML IJ SOLN
INTRAMUSCULAR | Status: DC | PRN
  Administered 2024-03-15: 4 mg via INTRAVENOUS

## 2024-03-15 MED ORDER — ONDANSETRON HCL 4 MG/2ML IJ SOLN
INTRAMUSCULAR | Status: AC
  Filled 2024-03-15: qty 2

## 2024-03-15 MED ORDER — MIDAZOLAM HCL 5 MG/5ML IJ SOLN
INTRAMUSCULAR | Status: DC | PRN
  Administered 2024-03-15: 2 mg via INTRAVENOUS

## 2024-03-15 MED ORDER — LACTATED RINGERS IV SOLN: INTRAVENOUS | Status: DC

## 2024-03-15 MED ORDER — FENTANYL CITRATE (PF) 100 MCG/2ML IJ SOLN
25.0000 ug | INTRAMUSCULAR | Status: DC | PRN
  Administered 2024-03-15 (×2): 50 ug via INTRAVENOUS

## 2024-03-15 MED ORDER — GLYCOPYRROLATE PF 0.2 MG/ML IJ SOSY
PREFILLED_SYRINGE | INTRAMUSCULAR | Status: DC | PRN
  Administered 2024-03-15: .2 mg via INTRAVENOUS

## 2024-03-15 MED ORDER — ACETAMINOPHEN 10 MG/ML IV SOLN
INTRAVENOUS | Status: DC | PRN
Start: 2024-03-15 — End: 2024-03-15
  Administered 2024-03-15: 1000 mg via INTRAVENOUS

## 2024-03-15 MED ORDER — DEXAMETHASONE SOD PHOSPHATE PF 10 MG/ML IJ SOLN
INTRAMUSCULAR | Status: DC | PRN
  Administered 2024-03-15: 10 mg via INTRAVENOUS

## 2024-03-15 MED ORDER — ACETAMINOPHEN 10 MG/ML IV SOLN: 1000.0000 mg | Freq: Once | INTRAVENOUS | Status: DC | PRN

## 2024-03-15 MED ORDER — PHENYLEPHRINE 80 MCG/ML (10ML) SYRINGE FOR IV PUSH (FOR BLOOD PRESSURE SUPPORT)
PREFILLED_SYRINGE | INTRAVENOUS | Status: AC
  Filled 2024-03-15: qty 10

## 2024-03-15 MED ORDER — KETOROLAC TROMETHAMINE 30 MG/ML IJ SOLN
INTRAMUSCULAR | Status: AC
  Filled 2024-03-15: qty 1

## 2024-03-15 MED ORDER — FENTANYL CITRATE (PF) 100 MCG/2ML IJ SOLN
INTRAMUSCULAR | Status: DC | PRN
  Administered 2024-03-15 (×2): 50 ug via INTRAVENOUS

## 2024-03-15 MED ORDER — OXYCODONE HCL 5 MG PO TABS
5.0000 mg | ORAL_TABLET | Freq: Four times a day (QID) | ORAL | 0 refills | Status: AC | PRN
Start: 2024-03-15 — End: 2024-03-25

## 2024-03-15 MED ORDER — LIDOCAINE HCL (PF) 1 % IJ SOLN
INTRAMUSCULAR | Status: AC
  Filled 2024-03-15: qty 30

## 2024-03-15 MED ORDER — PROPOFOL 10 MG/ML IV BOLUS
INTRAVENOUS | Status: AC
Start: 2024-03-15 — End: 2024-03-15
  Filled 2024-03-15: qty 20

## 2024-03-15 MED ORDER — LACTATED RINGERS IV SOLN: INTRAVENOUS | Status: DC | PRN

## 2024-03-15 MED ORDER — HYDROMORPHONE HCL 1 MG/ML IJ SOLN
INTRAMUSCULAR | Status: DC | PRN
  Administered 2024-03-15: .5 mg via INTRAVENOUS

## 2024-03-15 MED ORDER — LIDOCAINE HCL 1 % IJ SOLN
INTRAMUSCULAR | Status: DC | PRN
Start: 2024-03-15 — End: 2024-03-15
  Administered 2024-03-15: 10 mL

## 2024-03-15 SURGICAL SUPPLY — 41 items
BLADE SURG 15 STRL LF DISP TIS (BLADE) ×4 IMPLANT
BNDG COHESIVE 4X5 TAN STRL LF (GAUZE/BANDAGES/DRESSINGS) ×2 IMPLANT
BNDG COMPR ESMARK 4X3 LF (GAUZE/BANDAGES/DRESSINGS) ×2 IMPLANT
BNDG ELASTIC 4INX 5YD STR LF (GAUZE/BANDAGES/DRESSINGS) ×4 IMPLANT
BNDG GAUZE DERMACEA FLUFF 4 (GAUZE/BANDAGES/DRESSINGS) ×2 IMPLANT
CHLORAPREP W/TINT 26 (MISCELLANEOUS) ×2 IMPLANT
CORD BIPOLAR FORCEPS 12FT (ELECTRODE) ×2 IMPLANT
COVER BACK TABLE 60X90IN (DRAPES) ×2 IMPLANT
CUFF TOURN SGL QUICK 18X4 (TOURNIQUET CUFF) IMPLANT
CUFF TRNQT CYL 24X4X16.5-23 (TOURNIQUET CUFF) ×2 IMPLANT
DRAPE HAND 77X146 (DRAPES) ×2 IMPLANT
DRAPE OEC MINIVIEW 54X84 (DRAPES) ×2 IMPLANT
DRAPE SURG 17X23 STRL (DRAPES) ×2 IMPLANT
GAUZE SPONGE 4X4 12PLY STRL (GAUZE/BANDAGES/DRESSINGS) ×2 IMPLANT
GAUZE XEROFORM 1X8 LF (GAUZE/BANDAGES/DRESSINGS) ×2 IMPLANT
GLOVE BIO SURGEON STRL SZ7.5 (GLOVE) ×4 IMPLANT
GLOVE BIOGEL PI IND STRL 7.5 (GLOVE) ×2 IMPLANT
GOWN STRL REUS W/ TWL LRG LVL3 (GOWN DISPOSABLE) ×2 IMPLANT
GOWN STRL REUS W/TWL XL LVL3 (GOWN DISPOSABLE) ×2 IMPLANT
GOWN STRL SURGICAL XL XLNG (GOWN DISPOSABLE) ×4 IMPLANT
MANIFOLD NEPTUNE II (INSTRUMENTS) ×2 IMPLANT
NDL HYPO 25X5/8 SAFETYGLIDE (NEEDLE) IMPLANT
NEEDLE HYPO 25X5/8 SAFETYGLIDE (NEEDLE) ×1 IMPLANT
PACK BASIN DAY SURGERY FS (CUSTOM PROCEDURE TRAY) ×2 IMPLANT
PAD CAST 4YDX4 CTTN HI CHSV (CAST SUPPLIES) ×4 IMPLANT
SHEET MEDIUM DRAPE 40X70 STRL (DRAPES) ×2 IMPLANT
SLEEVE SCD COMPRESS KNEE MED (STOCKING) IMPLANT
SOLN 0.9% NACL POUR BTL 1000ML (IV SOLUTION) IMPLANT
SPIKE FLUID TRANSFER (MISCELLANEOUS) IMPLANT
SPLINT PLASTER CAST XFAST 4X15 (CAST SUPPLIES) ×20 IMPLANT
STOCKINETTE IMPERVIOUS 9X36 MD (GAUZE/BANDAGES/DRESSINGS) ×2 IMPLANT
SUCTION TUBE FRAZIER 10FR DISP (SUCTIONS) IMPLANT
SUT ETHILON 4 0 PS 2 18 (SUTURE) ×2 IMPLANT
SUT MNCRL AB 3-0 PS2 27 (SUTURE) ×2 IMPLANT
SUT VIC AB 3-0 FS2 27 (SUTURE) IMPLANT
SUT VIC AB 4-0 PS2 18 (SUTURE) IMPLANT
SYR BULB EAR ULCER 3OZ GRN STR (SYRINGE) ×4 IMPLANT
SYR CONTROL 10ML LL (SYRINGE) IMPLANT
TOWEL GREEN STERILE FF (TOWEL DISPOSABLE) ×4 IMPLANT
TUBE CONNECTING 20X1/4 (TUBING) IMPLANT
UNDERPAD 30X36 HEAVY ABSORB (UNDERPADS AND DIAPERS) ×2 IMPLANT

## 2024-03-15 NOTE — Discharge Instructions (Addendum)
    Hand Surgery Postop Instructions   Dressings: Maintain postoperative dressing until orthopedic follow-up.  Keep operative site clean and dry until orthopedic follow-up.  Wound Care: Keep your hand elevated above the level of your heart.  Do not allow it to dangle by your side. Moving your fingers is advised to stimulate circulation but will depend on the site of your surgery.  If you have a splint applied, your doctor will advise you regarding movement.  Activity: Do not drive or operate machinery until clearance given from physician. No heavy lifting with operative extremity.  Diet:  Drink liquids today or eat a light diet.  You may resume a regular diet tomorrow.    General expectations: Take prescribed medication if given, transition to over-the-counter medication as quickly as possible. Fingers may become slightly swollen.  Call your doctor if any of the following occur: Severe pain not relieved by pain medication. Elevated temperature. Dressing soaked with blood. Inability to move fingers. White or bluish color to fingers.   Per [facility] clinic policy, our goal is ensure optimal postoperative pain control with a multimodal pain management strategy. For all [facility] patients, our goal is to wean post-operative narcotic medications by 6 weeks post-operatively. If this is not possible due to utilization of pain medication prior to surgery, your [facility] doctor will support your acute post-operative pain control for the first 6 weeks postoperatively, with a plan to transition you back to your primary pain team following that. Hayden Moore will work to ensure a therapist, occupational.  Hayden Moore, M.D. Hand Surgery [facility]      Post Anesthesia Home Care Instructions  Activity: Get plenty of rest for the remainder of the day. A responsible individual must stay with you for 24 hours following the procedure.  For the next 24 hours, DO NOT: -Drive a  car -Advertising copywriter -Drink alcoholic beverages -Take any medication unless instructed by your physician -Make any legal decisions or sign important papers.  Meals: Start with liquid foods such as gelatin or soup. Progress to regular foods as tolerated. Avoid greasy, spicy, heavy foods. If nausea and/or vomiting occur, drink only clear liquids until the nausea and/or vomiting subsides. Call your physician if vomiting continues.  Special Instructions/Symptoms: Your throat may feel dry or sore from the anesthesia or the breathing tube placed in your throat during surgery. If this causes discomfort, gargle with warm salt water. The discomfort should disappear within 24 hours.  If you had a scopolamine patch placed behind your ear for the management of post- operative nausea and/or vomiting:  1. The medication in the patch is effective for 72 hours, after which it should be removed.  Wrap patch in a tissue and discard in the trash. Wash hands thoroughly with soap and water. 2. You may remove the patch earlier than 72 hours if you experience unpleasant side effects which may include dry mouth, dizziness or visual disturbances. 3. Avoid touching the patch. Wash your hands with soap and water after contact with the patch.      Next dose of Tylenol  may be given at 7:00pm if needed.

## 2024-03-15 NOTE — Anesthesia Postprocedure Evaluation (Signed)
 Anesthesia Post Note  Patient: Hayden Moore  Procedure(s) Performed: Left hand fourth metacarpal closed  reduction  percutaneous pinning (Left: Hand)     Patient location during evaluation: PACU Anesthesia Type: General Level of consciousness: awake and alert Pain management: pain level controlled Vital Signs Assessment: post-procedure vital signs reviewed and stable Respiratory status: spontaneous breathing, nonlabored ventilation, respiratory function stable and patient connected to nasal cannula oxygen Cardiovascular status: blood pressure returned to baseline and stable Postop Assessment: no apparent nausea or vomiting Anesthetic complications: no   No notable events documented.  Last Vitals:  Vitals:   03/15/24 1415 03/15/24 1437  BP: 132/77 124/86  Pulse: 76 80  Resp: 14 16  Temp:  37.2 C  SpO2: 95% 96%    Last Pain:  Vitals:   03/15/24 1437  TempSrc:   PainSc: 4                  [name]

## 2024-03-15 NOTE — Telephone Encounter (Signed)
 Patient called and ask if he can get a note with restrictions for his job.  Can you please put in there the specifics of what he can and can't do. Please and thank you he said. CB#[phone]

## 2024-03-15 NOTE — Anesthesia Preprocedure Evaluation (Signed)
 Anesthesia Evaluation  Patient identified by MRN, date of birth, ID band Patient awake    Reviewed: Allergy & Precautions, H&P , NPO status , Patient's Chart, lab work & pertinent test results  History of Anesthesia Complications Negative for: history of anesthetic complications  Airway Mallampati: II  TM Distance: >3 FB Neck ROM: Full    Dental no notable dental hx.    Pulmonary Current Smoker   Pulmonary exam normal breath sounds clear to auscultation       Cardiovascular hypertension, (-) angina (-) Past MI Normal cardiovascular exam Rhythm:Regular Rate:Normal     Neuro/Psych neg Seizures negative neurological ROS  negative psych ROS   GI/Hepatic Neg liver ROS,GERD  ,,  Endo/Other  negative endocrine ROS    Renal/GU negative Renal ROS  negative genitourinary   Musculoskeletal  (+) Arthritis ,    Abdominal   Peds negative pediatric ROS (+)  Hematology negative hematology ROS (+)   Anesthesia Other Findings   Reproductive/Obstetrics negative OB ROS                              Anesthesia Physical Anesthesia Plan  ASA: 2  Anesthesia Plan: General   Post-op Pain Management:    Induction: Intravenous  PONV Risk Score and Plan: 1 and Ondansetron , Dexamethasone , Midazolam  and Treatment may vary due to age or medical condition  Airway Management Planned: LMA  Additional Equipment: None  Intra-op Plan:   Post-operative Plan: Extubation in OR  Informed Consent: I have reviewed the patients History and Physical, chart, labs and discussed the procedure including the risks, benefits and alternatives for the proposed anesthesia with the patient or authorized representative who has indicated his/her understanding and acceptance.     Dental advisory given  Plan Discussed with: CRNA  Anesthesia Plan Comments:         Anesthesia Quick Evaluation

## 2024-03-15 NOTE — Transfer of Care (Signed)
 Immediate Anesthesia Transfer of Care Note  Patient: Hayden Moore  Procedure(s) Performed: Left hand fourth metacarpal closed vs. open reduction internal fixation and percutaneous pinning (Left: Hand)  Patient Location: PACU  Anesthesia Type:General  Level of Consciousness: awake, alert , oriented, and patient cooperative  Airway & Oxygen Therapy: Patient Spontanous Breathing and Patient connected to face mask oxygen  Post-op Assessment: Report given to RN, Post -op Vital signs reviewed and stable, and Patient moving all extremities  Post vital signs: Reviewed and stable  Last Vitals:  Vitals Value Taken Time  BP 134/73 03/15/24 13:40  Temp 37 C 03/15/24 13:40  Pulse 82 03/15/24 13:46  Resp 21 03/15/24 13:46  SpO2 97 % 03/15/24 13:46  Vitals shown include unfiled device data.  Last Pain:  Vitals:   03/15/24 1340  TempSrc:   PainSc: Asleep         Complications: No notable events documented.

## 2024-03-15 NOTE — Interval H&P Note (Signed)
 History and Physical Interval Note:  03/15/2024 11:05 AM  Hayden Moore  has presented today for surgery, with the diagnosis of LEFT HAND 4TH METACARPAL FRACTURE.  The various methods of treatment have been discussed with the patient and family. After consideration of risks, benefits and other options for treatment, the patient has consented to  Procedure(s) with comments: OPEN REDUCTION INTERNAL FIXATION (ORIF) METACARPAL (Left) - CLOSED VS OPEN REDUCTION INTERNAL FIXATION AND PERCUTANEOUS PINNING as a surgical intervention.  The patient's history has been reviewed, patient examined, no change in status, stable for surgery.  I have reviewed the patient's chart and labs.  Questions were answered to the patient's satisfaction.     Hayden Moore

## 2024-03-15 NOTE — Anesthesia Procedure Notes (Signed)
 Procedure Name: LMA Insertion Date/Time: 03/15/2024 12:45 PM  Performed by: Denton Niels CROME, CRNAPre-anesthesia Checklist: Patient identified, Emergency Drugs available, Suction available, Patient being monitored and Timeout performed Patient Re-evaluated:Patient Re-evaluated prior to induction Oxygen Delivery Method: Circle system utilized Preoxygenation: Pre-oxygenation with 100% oxygen Induction Type: IV induction Ventilation: Mask ventilation without difficulty LMA: LMA with gastric port inserted LMA Size: 5.0 Number of attempts: 1 Placement Confirmation: positive ETCO2 Tube secured with: Tape Dental Injury: Teeth and Oropharynx as per pre-operative assessment
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
## 2024-03-16 ENCOUNTER — Encounter (HOSPITAL_BASED_OUTPATIENT_CLINIC_OR_DEPARTMENT_OTHER): Payer: Self-pay | Admitting: Orthopedic Surgery

## 2024-03-17 ENCOUNTER — Other Ambulatory Visit: Payer: Self-pay | Admitting: Orthopedic Surgery

## 2024-03-27 NOTE — Progress Notes (Unsigned)
   Hayden Moore - 33 y.o. male MRN [id number]  Date of birth: 05-05-1990  Office Visit Note: Visit Date: 03/28/2024 PCP: Tanda Bleacher, MD Referred by: Tanda Bleacher, MD  Subjective:  HPI: Hayden Moore is a 33 y.o. male who presents today for follow up 2 weeks status post left hand ring finger metacarpal fracture closed reduction and percutaneous pinning.  Doing well overall, has been compliant with splinting as instructed.  Pertinent ROS were reviewed with the patient and found to be negative unless otherwise specified above in HPI.   Assessment & Plan: Visit Diagnoses: No diagnosis found.  Plan: He is doing well overall.  X-rays demonstrate stable appearance of the left hand ring finger metacarpal fracture.  Pin site remains clean dry and intact.  He will be transition today to a ulnar gutter cast for an additional 2 weeks.  Can follow-up at that time for repeat clinical and radiographic check, likely pin removal at that time.  Follow-up: No follow-ups on file.   Meds & Orders: No orders of the defined types were placed in this encounter.  No orders of the defined types were placed in this encounter.    Procedures: No procedures performed       Objective:   Vital Signs: There were no vitals taken for this visit.  Ortho Exam Left hand - Digits in appropriate alignment with appropriate cascade, ring finger pin site remains clean dry and intact, no significant rotational abnormality to the ring finger, digit remains warm well-perfused with appropriate sensation distally  Imaging: No results found.   Hayden Moore Hayden Moore, M.D. [facility], Hand Surgery

## 2024-03-28 ENCOUNTER — Ambulatory Visit: Admitting: Orthopedic Surgery

## 2024-03-28 ENCOUNTER — Other Ambulatory Visit (INDEPENDENT_AMBULATORY_CARE_PROVIDER_SITE_OTHER)

## 2024-03-28 DIAGNOSIS — S6292XD Unspecified fracture of left wrist and hand, subsequent encounter for fracture with routine healing: Secondary | ICD-10-CM

## 2024-03-28 DIAGNOSIS — S6292XA Unspecified fracture of left wrist and hand, initial encounter for closed fracture: Secondary | ICD-10-CM

## 2024-03-29 DIAGNOSIS — S6292XA Unspecified fracture of left wrist and hand, initial encounter for closed fracture: Secondary | ICD-10-CM

## 2024-04-02 ENCOUNTER — Ambulatory Visit: Admitting: Family Medicine

## 2024-04-08 NOTE — Therapy (Unsigned)
 OUTPATIENT OCCUPATIONAL THERAPY ORTHO EVALUATION  Patient Name: Hayden Moore MRN: [id number] DOB:1990/12/17, 33 y.o., male Today's Date: 04/10/2024  PCP: Tanda FELIX MD  REFERRING PROVIDER: Dr. Arlinda   END OF SESSION:  OT End of Session - 04/10/24 0809     Visit Number 1    Number of Visits 7    Date for Recertification  05/24/24    Authorization Type BCBS    OT Start Time 0831    OT Stop Time 0857    OT Time Calculation (min) 26 min    Equipment Utilized During Treatment orthotic materials    Activity Tolerance Patient tolerated treatment well;No increased pain;Patient limited by pain    Behavior During Therapy [facility] for tasks assessed/performed          Past Medical History:  Diagnosis Date   Arthritis    GERD (gastroesophageal reflux disease)    Hypertension    Past Surgical History:  Procedure Laterality Date   EMBOLECTOMY Right 01/23/2023   Procedure: ARM BRACHIAL EMBOLECTOMY;  Surgeon: Lanis Fonda BRAVO, MD;  Location: [facility];  Service: Vascular;  Laterality: Right;   OPEN REDUCTION INTERNAL FIXATION (ORIF) METACARPAL Left 03/15/2024   Procedure: Left hand fourth metacarpal closed  reduction  percutaneous pinning;  Surgeon: Arlinda Buster, MD;  Location: [facility];  Service: Orthopedics;  Laterality: Left;   Patient Active Problem List   Diagnosis Date Noted   Closed left hand fracture 03/29/2024   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   TOBACCO USER 03/17/2009   Dermatophytosis of body 03/16/2009   PENILE PAIN 02/10/2009   OBESITY 04/09/2008   TINEA VERSICOLOR 02/12/2007   HYPERTENSION, BENIGN SYSTEMIC 06/22/2006    ONSET DATE: DOS 03/15/24  REFERRING DIAG: D37.07KJ (ICD-10-CM) - Closed fracture of left hand, initial encounter   THERAPY DIAG:  Localized edema  Muscle weakness (generalized)  Other lack of coordination  Pain in right hand  Stiffness of right hand, not elsewhere classified  Rationale for  Evaluation and Treatment: Rehabilitation  SUBJECTIVE:   SUBJECTIVE STATEMENT: Today he showed up quite late for evaluation because he also showed up late for his doctor's appointment, so evaluation was somewhat rushed and limited today.  4 weeks s/p right 4th metacarpal fx pinning. 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  He states he is having some pain after the pin has been removed recently.  He is a bit swollen and sore.     PERTINENT HISTORY: None other related to this injury  PRECAUTIONS: None  RED FLAGS: None   WEIGHT BEARING RESTRICTIONS: Yes nonweightbearing in right hand  PAIN:  Are you having pain? Yes: NPRS scale: 4/10 now at rest  Pain location: Right hand pin removal site Pain description: Sore and aching Aggravating factors: Attempted weightbearing or strong motion Relieving factors: Rest  FALLS: Has patient fallen in last 6 months? Yes. Number of falls 1-this injury, not considered a fall risk  PLOF: Independent  PATIENT GOALS: To safely improve the use of his right hand and arm for daily and functional ability  NEXT MD VISIT: Approximately 2 to 4 weeks   OBJECTIVE: (All objective assessments below are from initial evaluation on: 04/10/24 unless otherwise specified.)   HAND DOMINANCE: Right   ADLs: Overall ADLs: States decreased ability to grab, hold household objects, pain and difficulty to open containers, perform FMS tasks (manipulate fasteners on clothing), etc.   FUNCTIONAL OUTCOME MEASURES:  Eval: Patient Specific Functional Scale: TBD in next session as time allows (Higher Score  =  Better Ability for the Selected Tasks)      UPPER EXTREMITY ROM     Shoulder to Wrist AROM Right eval  Elbow flexion WNL  Elbow extension WNL  Forearm supination ~75  Forearm pronation  ~85  Wrist flexion 66  Wrist extension 55  (Blank rows = not tested)   Hand AROM Right eval  Full Fist Ability (or  Gap to Distal Palmar Crease) Unable to due to pain and stiffness in the ring finger  Thumb Opposition  (Kapandji Scale)  4/10  Ring MCP (0-90) TBD   Ring PIP (0-100)    Ring DIP (0-70)    (Blank rows = not tested)   HAND FUNCTION: Eval: Observed weakness in affected Rt hand.  Details TBD when safe Grip strength Right: TBD lbs, Left: TBD lbs   COORDINATION: Eval: Observed coordination impairments with affected Rt hand.  Details TBD in the next session as time allows Box and Blocks Test: Rt: TBD Blocks today (TBD is [facility])   SENSATION: Eval:  Light touch intact today, though diminished around sx area    EDEMA:   Eval:  Mildly swollen in rt hand and wrist today, compared to the left hand  COGNITION: Eval: Overall cognitive status: WFL for evaluation today   OBSERVATIONS:   Eval: Pin removal site is still bleeding somewhat, but OT helps that get under control.  Hand is tender and swollen, he has some pain and apprehension trying to bend at the MCP joint, but wrist motion is not overly painful today   Right hand fourth metacarpal fracture and CRPP  TODAY'S TREATMENT:  Post-evaluation treatment:   Custom orthotic fabrication was indicated due to pt's healing right hand fourth metacarpal fracture and need for safe, functional positioning. OT fabricated custom forearm-based custom P1 blocking orthosis for pt today to immobilize the MP joints of the fingers as well as the wrist but allow for IP joint motion. It fit well with no areas of pressure, pt states a comfortable fit. Pt was educated on the wearing schedule (on at all times except for hygiene and exercises), to avoid exposing it to sources of heat, to wipe clean as needed (do not wash, use harsh detergents), to call or come in ASAP if it is causing any irritation or is not achieving desired function. It will be checked/adjusted in upcoming sessions, as needed. Pt states understanding all directions.     For safety/self-care, the  patient was recommended to do no pushing/pulling or weightbearing through the right hand or arm now.  The patient was taught to care for the postsurgical wound by doing light touch desensitization, gentle scar mobilizations, keep moist with a Vaseline, keep covered until completely healed.  The patient should not be soaking the wound, either.  The patient can quickly shower and dab dry.  The patient was given a compressive stockinette to help with swelling.  The patient was also given the following home exercise program to perform in a nonpainful fashion approximately 4 times a day.  Each one was reviewed with the patient, with performance back to show understanding and no significant pain.  The patient leaves without any questions or concerns.  Exercises - Turn [employer] Facing Up & Down  - 4-6 x daily - 10-15 reps - Bend and Pull Back Wrist SLOWLY  - 4 x daily - 10-15 reps - Windshield Wipers   - 4 x  daily - 10-15 reps - Tendon Glides  - 4-6 x daily - 3-5 reps - 2-3 seconds hold - Thumb Opposition  - 4-6 x daily - 10 reps   PATIENT EDUCATION: Education details: See tx section above for details  Person educated: Patient Education method: Verbal Instruction, Teach back, Handouts  Education comprehension: States and demonstrates understanding, Additional Education required    HOME EXERCISE PROGRAM: Access Code: [email or web address] URL: [email or web address] Date: 04/10/2024 Prepared by: Melvenia Ada   GOALS: Goals reviewed with patient? Yes   SHORT TERM GOALS: (STG required if POC>30 days) Target Date: 04/26/24  Pt will obtain protective, custom orthotic. Goal status: 04/10/24:  MET   2.  Pt will demo/state understanding of initial HEP to improve pain levels and prerequisite motion. Goal status: INITIAL   LONG TERM GOALS: Target Date: 05/24/24  Pt will improve functional ability by decreased impairment per PSFS assessment from TBD to TBD or better, for better quality of  life. Goal status: INITIAL (pending baseline measures)  2.  Pt will improve grip strength in right hand from unsafe to test to at least 40lbs for functional use at home and in IADLs. Goal status: INITIAL  3.  Pt will improve A/ROM in right hand ring finger total active motion to at least 200*, to have functional motion for tasks like reach and grasp.  Goal status: INITIAL  4.  Pt will improve strength in right wrist flexion/extension from apparent 3 -/5 MMT to at least 4+/5 MMT to have increased functional ability to carry out selfcare and higher-level homecare tasks with less difficulty. Goal status: INITIAL  5.  Pt will improve coordination skills in right hand, as seen by within functional limit score on box and blocks testing to have increased functional ability to carry out fine motor tasks (fasteners, etc.) and more complex, coordinated IADLs (meal prep, sports, etc.).  Goal status: INITIAL  6.  Pt will decrease pain at rest from 4/10 to 1/10 or better to have better sleep and occupational participation in daily roles. Goal status: INITIAL   ASSESSMENT:  CLINICAL IMPRESSION: Patient is a 33 y.o. male who was seen today for occupational therapy evaluation for stiffness, weakness, decreased coordination, strength and decreased functional ability in the right hand and wrist as a result of fracture, subsequent percutaneous pinning, etc.  The patient will benefit from outpatient occupational therapy to decrease symptoms, improve functional upper extremity use, and increase quality of life.  PERFORMANCE DEFICITS: in functional skills including ADLs, IADLs, coordination, dexterity, proprioception, sensation, edema, ROM, strength, pain, fascial restrictions, Fine motor control, body mechanics, endurance, decreased knowledge of precautions, and UE functional use, cognitive skills including problem solving and safety awareness, and psychosocial skills including coping strategies, environmental  adaptation, habits, and routines and behaviors.   IMPAIRMENTS: are limiting patient from ADLs, IADLs, rest and sleep, and leisure.   COMORBIDITIES: may have co-morbidities  that affects occupational performance. Patient will benefit from skilled OT to address above impairments and improve overall function.  MODIFICATION OR ASSISTANCE TO COMPLETE EVALUATION: No modification of tasks or assist necessary to complete an evaluation.  OT OCCUPATIONAL PROFILE AND HISTORY: Problem focused assessment: Including review of records relating to presenting problem.  CLINICAL DECISION MAKING: LOW - limited treatment options, no task modification necessary  REHAB POTENTIAL: Excellent  EVALUATION COMPLEXITY: Low      PLAN:  OT FREQUENCY: 1-2x/week  OT DURATION: 6 weeks through 05/24/24 and up to 7 total visits as needed   PLANNED INTERVENTIONS: 97535 self  care/ADL training, 02889 therapeutic exercise, 97530 therapeutic activity, 97112 neuromuscular re-education, 97140 manual therapy, 97035 ultrasound, [id number] electrical stimulation (manual), [id number] Orthotic Initial, [id number] Orthotic/Prosthetic subsequent, compression bandaging, Dry needling, energy conservation, coping strategies training, and patient/family education  RECOMMENDED OTHER SERVICES: none now    CONSULTED AND AGREED WITH PLAN OF CARE: Patient  PLAN FOR NEXT SESSION:   Review initial HEP and recommendations, check orthosis, upgrade per protocols    Melvenia Ada, OTR/L, CHT  04/10/2024, 4:18 PM

## 2024-04-09 NOTE — Progress Notes (Unsigned)
° °  Hayden Moore - 33 y.o. male MRN [id number]  Date of birth: [birth year]  Office Visit Note: Visit Date: 04/10/2024 PCP: Tanda Bleacher, MD Referred by: Tanda Bleacher, MD  Subjective:  HPI: Hayden Moore is a 33 y.o. male who presents today for follow up 4 weeks status post left hand ring finger metacarpal fracture closed reduction and percutaneous pinning.  Pertinent ROS were reviewed with the patient and found to be negative unless otherwise specified above in HPI.   Assessment & Plan: Visit Diagnoses: No diagnosis found.  Plan: ***  Follow-up: No follow-ups on file.   Meds & Orders: No orders of the defined types were placed in this encounter.  No orders of the defined types were placed in this encounter.    Procedures: No procedures performed       Objective:   Vital Signs: There were no vitals taken for this visit.  Ortho Exam ***  Imaging: No results found.   Joliana Claflin Afton Alderton, M.D. [facility], Hand Surgery

## 2024-04-10 ENCOUNTER — Other Ambulatory Visit (INDEPENDENT_AMBULATORY_CARE_PROVIDER_SITE_OTHER)

## 2024-04-10 ENCOUNTER — Ambulatory Visit: Admitting: Rehabilitative and Restorative Service Providers"

## 2024-04-10 ENCOUNTER — Ambulatory Visit: Admitting: Orthopedic Surgery

## 2024-04-10 ENCOUNTER — Encounter: Payer: Self-pay | Admitting: Rehabilitative and Restorative Service Providers"

## 2024-04-10 DIAGNOSIS — S6292XD Unspecified fracture of left wrist and hand, subsequent encounter for fracture with routine healing: Secondary | ICD-10-CM

## 2024-04-10 DIAGNOSIS — M79641 Pain in right hand: Secondary | ICD-10-CM | POA: Diagnosis not present

## 2024-04-10 DIAGNOSIS — R278 Other lack of coordination: Secondary | ICD-10-CM | POA: Diagnosis not present

## 2024-04-10 DIAGNOSIS — M25641 Stiffness of right hand, not elsewhere classified: Secondary | ICD-10-CM

## 2024-04-10 DIAGNOSIS — R600 Localized edema: Secondary | ICD-10-CM

## 2024-04-10 DIAGNOSIS — M6281 Muscle weakness (generalized): Secondary | ICD-10-CM | POA: Diagnosis not present

## 2024-04-11 ENCOUNTER — Ambulatory Visit: Admitting: Occupational Therapy

## 2024-05-01 NOTE — Therapy (Signed)
 " OUTPATIENT OCCUPATIONAL THERAPY TREATMENT NOTE  Patient Name: Hayden Moore MRN: [id number] DOB:[birth year], 34 y.o., male Today's Date: 05/03/2024  PCP: Tanda FELIX MD  REFERRING PROVIDER: Dr. Arlinda   END OF SESSION:  OT End of Session - 05/03/24 0941     Visit Number 2    Number of Visits 7    Date for Recertification  05/24/24    Authorization Type BCBS    OT Start Time [phone]    OT Stop Time 1019    OT Time Calculation (min) 38 min    Equipment Utilized During Treatment orthotic materials    Activity Tolerance Patient tolerated treatment well;No increased pain;Patient limited by pain    Behavior During Therapy [facility] for tasks assessed/performed           Past Medical History:  Diagnosis Date   Arthritis    GERD (gastroesophageal reflux disease)    Hypertension    Past Surgical History:  Procedure Laterality Date   EMBOLECTOMY Right 01/23/2023   Procedure: ARM BRACHIAL EMBOLECTOMY;  Surgeon: Lanis Fonda BRAVO, MD;  Location: [facility] OR;  Service: Vascular;  Laterality: Right;   OPEN REDUCTION INTERNAL FIXATION (ORIF) METACARPAL Left 03/15/2024   Procedure: Left hand fourth metacarpal closed  reduction  percutaneous pinning;  Surgeon: Arlinda Buster, MD;  Location: [facility];  Service: Orthopedics;  Laterality: Left;   Patient Active Problem List   Diagnosis Date Noted   Closed left hand fracture 03/29/2024   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   TOBACCO USER 03/17/2009   Dermatophytosis of body 03/16/2009   PENILE PAIN 02/10/2009   OBESITY 04/09/2008   TINEA VERSICOLOR 02/12/2007   HYPERTENSION, BENIGN SYSTEMIC 06/22/2006    ONSET DATE: DOS 03/15/24  REFERRING DIAG: D37.07KJ (ICD-10-CM) - Closed fracture of left hand, initial encounter   THERAPY DIAG:  Localized edema  Muscle weakness (generalized)  Other lack of coordination  Pain in right hand  Stiffness of right hand, not elsewhere classified  Rationale for Evaluation  and Treatment: Rehabilitation  PERTINENT HISTORY: None other related to this injury Today he showed up quite late for evaluation because he also showed up late for his doctor's appointment, so evaluation was somewhat rushed and limited today.  4 weeks s/p right 4th metacarpal fx pinning. 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  He states he is having some pain after the pin has been removed recently.  He is a bit swollen and sore.   PRECAUTIONS: None  RED FLAGS: None   WEIGHT BEARING RESTRICTIONS: Yes nonweightbearing in right hand   SUBJECTIVE:   SUBJECTIVE STATEMENT: 7 weeks s/p right 4th metacarpal fx pinning. He has not come to therapy for approximately 3 weeks, and today states his grip strength is poor and he still cannot make a complete fist, but his pain has improved.     PAIN:  Are you having pain? Yes: NPRS scale:  0/10 now at rest and only mild pain with exercises in past week  Pain location: Right hand pin removal site Pain description: Sore and aching Aggravating factors: Attempted weightbearing or strong motion Relieving factors: Rest   PATIENT GOALS: To safely improve the use of his right hand and arm for daily and functional ability  NEXT MD VISIT: Approximately 2 to 4 weeks   OBJECTIVE: (All objective assessments below are from initial evaluation on: 04/10/24 unless otherwise specified.)   HAND DOMINANCE: Right  ADLs: Overall ADLs: States decreased ability to grab, hold household objects, pain and difficulty to open containers, perform FMS tasks (manipulate fasteners on clothing), etc.   FUNCTIONAL OUTCOME MEASURES: Eval: Patient Specific Functional Scale: TBD in next session as time allows (Higher Score  =  Better Ability for the Selected Tasks)      UPPER EXTREMITY ROM     Shoulder to Wrist AROM Right eval Rt 05/03/24  Elbow flexion WNL   Elbow extension WNL   Forearm supination ~75    Forearm pronation  ~85   Wrist flexion 66 53  Wrist extension 55 45  (Blank rows = not tested)   Hand AROM Right eval Rt 05/03/24  Full Fist Ability (or Gap to Distal Palmar Crease) Unable to due to pain and stiffness in the ring finger Almost make a full fist  Thumb Opposition  (Kapandji Scale)  4/10 6/10  Ring MCP (0-90) TBD  0- 43  Ring PIP (0-100)   0- 105  Ring DIP (0-70)   0- 45  (Blank rows = not tested)   HAND FUNCTION: 05/03/24: Grip strength Right: 86lbs, Left: 33lbs   COORDINATION: Eval: Observed coordination impairments with affected Rt hand.  Details TBD in the next session as time allows Box and Blocks Test: Rt: TBD Blocks today (TBD is [facility])   SENSATION: Eval:  Light touch intact today, though diminished around sx area    EDEMA:   Eval:  Mildly swollen in rt hand and wrist today, compared to the left hand  OBSERVATIONS:   Eval: Pin removal site is still bleeding somewhat, but OT helps that get under control.  Hand is tender and swollen, he has some pain and apprehension trying to bend at the MCP joint, but wrist motion is not overly painful today   Right hand fourth metacarpal fracture and CRPP  TODAY'S TREATMENT:  05/03/24: He performs active range of motion at the wrist and hand, as well as gripping today for new baseline measures as well as exercises.  As he has not been seen for about 3 weeks, there are several new exercises and activities that he can begin per protocols and according to his current status.  These things are bolded below, and each 1 was shown to him and explained and demonstrated so as to not cause pain, and be the most effective.  He states understanding stretches, and does them in a way that limits pain.  Additionally, OT adjusted his orthosis to become a hand-based MP blocking orthosis to allow better wrist and forearm motion at all times.  We also discussed safety and self-care and weaning from the orthosis for light tasks that way no more than  1 or 2 pounds.    Also, OT educates on light isometric grip training that can be done.  He does tolerate it well with some pain, and also tolerates pinching with slight pain that resolves when he is finished.  He is encouraged to use ice as needed to relieve soreness and pain.  Exercises/activities reviewed and performed today (bolded are new) - Wrist Flexion Stretch  - 4 x daily - 3-5 reps - 15 sec hold - Wrist Prayer Stretch  - 4 x daily - 3-5 reps - 15 sec hold - BACK KNUCKLE STRETCHES   - 4 x daily - 3-5 reps - 15 sec hold - HOOK Stretch  - 4 x daily - 3-5 reps - 15-20 sec hold - Seated Finger Composite Flexion Stretch  - 4 x  daily - 3-5 reps - 15 hold - Thumb stretch  - 4 x daily - 3-5 reps - 15-20 sec hold - Bend and Pull Back Wrist SLOWLY  - 4 x daily - 10-15 reps - Tendon Glides  - 4-6 x daily - 3-5 reps - 2-3 seconds hold - Thumb Opposition  - 4-6 x daily - 10 reps - Towel Roll Grip with Forearm in Neutral  - 3 x daily - 5 reps - 10 sec hold   PATIENT EDUCATION: Education details: See tx section above for details  Person educated: Patient Education method: Engineer, Structural, Teach back, Handouts  Education comprehension: States and demonstrates understanding, Additional Education required    HOME EXERCISE PROGRAM: Access Code: [email or web address] URL: [email or web address] Date: 05/03/2024 Prepared by: Melvenia Ada   GOALS: Goals reviewed with patient? Yes   SHORT TERM GOALS: (STG required if POC>30 days) Target Date: 04/26/24  Pt will obtain protective, custom orthotic. Goal status: 04/10/24:  MET   2.  Pt will demo/state understanding of initial HEP to improve pain levels and prerequisite motion. Goal status: 05/03/2024: Goal met   LONG TERM GOALS: Target Date: 05/24/24  Pt will improve functional ability by decreased impairment per PSFS assessment from TBD to TBD or better, for better quality of life. Goal status: INITIAL (pending baseline  measures)  2.  Pt will improve grip strength in right hand from unsafe to test to at least 40lbs for functional use at home and in IADLs. Goal status: INITIAL  3.  Pt will improve A/ROM in right hand ring finger total active motion to at least 200*, to have functional motion for tasks like reach and grasp.  Goal status: INITIAL  4.  Pt will improve strength in right wrist flexion/extension from apparent 3 -/5 MMT to at least 4+/5 MMT to have increased functional ability to carry out selfcare and higher-level homecare tasks with less difficulty. Goal status: INITIAL  5.  Pt will improve coordination skills in right hand, as seen by within functional limit score on box and blocks testing to have increased functional ability to carry out fine motor tasks (fasteners, etc.) and more complex, coordinated IADLs (meal prep, sports, etc.).  Goal status: INITIAL  6.  Pt will decrease pain at rest from 4/10 to 1/10 or better to have better sleep and occupational participation in daily roles. Goal status: INITIAL   ASSESSMENT:  CLINICAL IMPRESSION: 05/03/24: He has been managing fairly well over the past 3 weeks himself, though his wrist is a bit stiff from being in the long arm orthosis too much.  We have a newly modified orthosis today, plenty of new stretches and activities to perform which he tolerated well.  Eval: Patient is a 34 y.o. male who was seen today for occupational therapy evaluation for stiffness, weakness, decreased coordination, strength and decreased functional ability in the right hand and wrist as a result of fracture, subsequent percutaneous pinning, etc.  The patient will benefit from outpatient occupational therapy to decrease symptoms, improve functional upper extremity use, and increase quality of life.     PLAN:  OT FREQUENCY: 1-2x/week  OT DURATION: 6 weeks through 05/24/24 and up to 7 total visits as needed   PLANNED INTERVENTIONS: 97535 self care/ADL training, 02889  therapeutic exercise, 97530 therapeutic activity, 97112 neuromuscular re-education, 97140 manual therapy, 97035 ultrasound, 97032 electrical stimulation (manual), [id number] Orthotic Initial, [id number] Orthotic/Prosthetic subsequent, compression bandaging, Dry needling, energy conservation, coping strategies training, and patient/family education  RECOMMENDED OTHER SERVICES: none now  CONSULTED AND AGREED WITH PLAN OF CARE: Patient  PLAN FOR NEXT SESSION:  Check new orthosis, continue weaning, encourage functional activities, check motion and strength  Melvenia Ada, OTR/L, CHT  05/03/2024, 12:13 PM   "

## 2024-05-03 ENCOUNTER — Ambulatory Visit: Payer: Self-pay | Admitting: Rehabilitative and Restorative Service Providers"

## 2024-05-03 ENCOUNTER — Encounter: Payer: Self-pay | Admitting: Rehabilitative and Restorative Service Providers"

## 2024-05-03 DIAGNOSIS — M79641 Pain in right hand: Secondary | ICD-10-CM

## 2024-05-03 DIAGNOSIS — M6281 Muscle weakness (generalized): Secondary | ICD-10-CM

## 2024-05-03 DIAGNOSIS — R278 Other lack of coordination: Secondary | ICD-10-CM

## 2024-05-03 DIAGNOSIS — M25641 Stiffness of right hand, not elsewhere classified: Secondary | ICD-10-CM

## 2024-05-03 DIAGNOSIS — R600 Localized edema: Secondary | ICD-10-CM

## 2024-05-07 NOTE — Therapy (Incomplete)
 " OUTPATIENT OCCUPATIONAL THERAPY TREATMENT NOTE  Patient Name: Hayden Moore MRN: [id number] DOB:04-Mar-1991, 34 y.o., male Today's Date: 05/07/2024  PCP: Tanda FELIX MD  REFERRING PROVIDER: Dr. Arlinda   END OF SESSION:     Past Medical History:  Diagnosis Date   Arthritis    GERD (gastroesophageal reflux disease)    Hypertension    Past Surgical History:  Procedure Laterality Date   EMBOLECTOMY Right 01/23/2023   Procedure: ARM BRACHIAL EMBOLECTOMY;  Surgeon: Lanis Fonda BRAVO, MD;  Location: [facility] OR;  Service: Vascular;  Laterality: Right;   OPEN REDUCTION INTERNAL FIXATION (ORIF) METACARPAL Left 03/15/2024   Procedure: Left hand fourth metacarpal closed  reduction  percutaneous pinning;  Surgeon: Arlinda Buster, MD;  Location: [facility];  Service: Orthopedics;  Laterality: Left;   Patient Active Problem List   Diagnosis Date Noted   Closed left hand fracture 03/29/2024   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  Rationale for Evaluation and Treatment: Rehabilitation  PERTINENT HISTORY: None other related to this injury Today he showed up quite late for evaluation because he also showed up late for his doctor's appointment, so evaluation was somewhat rushed and limited today.  4 weeks s/p right 4th metacarpal fx pinning. 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  He states he is having some pain after the pin has been removed recently.  He is a bit swollen and sore.   PRECAUTIONS: None  RED FLAGS: None   WEIGHT BEARING RESTRICTIONS: Yes nonweightbearing  in right hand   SUBJECTIVE:   SUBJECTIVE STATEMENT: 8 weeks s/p right 4th metacarpal fx pinning. He states ***.      PAIN:  Are you having pain? Yes: NPRS scale: ***  0/10 now at rest and only mild pain with exercises in past week  Pain location: Right hand pin removal site Pain description: Sore and aching Aggravating factors: Attempted weightbearing or strong motion Relieving factors: Rest   PATIENT GOALS: To safely improve the use of his right hand and arm for daily and functional ability  NEXT MD VISIT: Approximately 2 to 4 weeks   OBJECTIVE: (All objective assessments below are from initial evaluation on: 04/10/24 unless otherwise specified.)   HAND DOMINANCE: Right   ADLs: Overall ADLs: States decreased ability to grab, hold household objects, pain and difficulty to open containers, perform FMS tasks (manipulate fasteners on clothing), etc.   FUNCTIONAL OUTCOME MEASURES: 05/08/24: Patient Specific Functional Scale: *** in next session as time allows (Higher Score  =  Better Ability for the Selected Tasks)      UPPER EXTREMITY ROM     Shoulder to Wrist AROM Right eval Rt 05/03/24 Rt 05/08/24  Elbow flexion WNL    Elbow extension WNL    Forearm supination ~75    Forearm pronation  ~85    Wrist flexion 66 53 ***  Wrist extension 55 45 ***  (Blank rows = not tested)   Hand AROM Right eval Rt 05/03/24  Full Fist Ability (or Gap to Distal Palmar Crease) Unable to due to pain and stiffness in  the ring finger Almost make a full fist  Thumb Opposition  (Kapandji Scale)  4/10 6/10  Ring MCP (0-90) TBD  0- 43  Ring PIP (0-100)   0- 105  Ring DIP (0-70)   0- 45  (Blank rows = not tested)   HAND FUNCTION: 05/08/24: Lt grip: *** #  05/03/24: Grip strength Right: 86lbs, Left: 33lbs   COORDINATION: Eval: Observed coordination impairments with affected Rt hand.  Details TBD in the next session as time allows Box and Blocks Test: Rt: TBD Blocks today (TBD is [employer])    SENSATION: Eval:  Light touch intact today, though diminished around sx area    EDEMA:   Eval:  Mildly swollen in rt hand and wrist today, compared to the left hand  OBSERVATIONS:   Eval: Pin removal site is still bleeding somewhat, but OT helps that get under control.  Hand is tender and swollen, he has some pain and apprehension trying to bend at the MCP joint, but wrist motion is not overly painful today   Right hand fourth metacarpal fracture and CRPP  TODAY'S TREATMENT:  05/08/24: *** Check new orthosis, continue weaning, encourage functional activities, check motion and strength   05/03/24: He performs active range of motion at the wrist and hand, as well as gripping today for new baseline measures as well as exercises.  As he has not been seen for about 3 weeks, there are several new exercises and activities that he can begin per protocols and according to his current status.  These things are bolded below, and each 1 was shown to him and explained and demonstrated so as to not cause pain, and be the most effective.  He states understanding stretches, and does them in a way that limits pain.  Additionally, OT adjusted his orthosis to become a hand-based MP blocking orthosis to allow better wrist and forearm motion at all times.  We also discussed safety and self-care and weaning from the orthosis for light tasks that way no more than 1 or 2 pounds.    Also, OT educates on light isometric grip training that can be done.  He does tolerate it well with some pain, and also tolerates pinching with slight pain that resolves when he is finished.  He is encouraged to use ice as needed to relieve soreness and pain.  Exercises/activities reviewed and performed today (bolded are new) - Wrist Flexion Stretch  - 4 x daily - 3-5 reps - 15 sec hold - Wrist Prayer Stretch  - 4 x daily - 3-5 reps - 15 sec hold - BACK KNUCKLE STRETCHES   - 4 x daily - 3-5 reps - 15 sec hold - HOOK Stretch  - 4 x daily -  3-5 reps - 15-20 sec hold - Seated Finger Composite Flexion Stretch  - 4 x daily - 3-5 reps - 15 hold - Thumb stretch  - 4 x daily - 3-5 reps - 15-20 sec hold - Bend and Pull Back Wrist SLOWLY  - 4 x daily - 10-15 reps - Tendon Glides  - 4-6 x daily - 3-5 reps - 2-3 seconds hold - Thumb Opposition  - 4-6 x daily - 10 reps - Towel Roll Grip with Forearm in Neutral  - 3 x daily - 5 reps - 10 sec hold   PATIENT EDUCATION: Education details: See tx section above for details  Person educated: Patient Education method: Verbal Instruction, Teach back, Handouts  Education comprehension: States and demonstrates understanding, Additional  Education required    HOME EXERCISE PROGRAM: Access Code: [email or web address] URL: [email or web address] Date: 05/03/2024 Prepared by: Melvenia Ada   GOALS: Goals reviewed with patient? Yes   SHORT TERM GOALS: (STG required if POC>30 days) Target Date: 04/26/24  Pt will obtain protective, custom orthotic. Goal status: 04/10/24:  MET   2.  Pt will demo/state understanding of initial HEP to improve pain levels and prerequisite motion. Goal status: 05/03/2024: Goal met   LONG TERM GOALS: Target Date: 05/24/24  Pt will improve functional ability by decreased impairment per PSFS assessment from TBD to TBD or better, for better quality of life. Goal status: INITIAL (pending baseline measures)  2.  Pt will improve grip strength in right hand from unsafe to test to at least 40lbs for functional use at home and in IADLs. Goal status: INITIAL  3.  Pt will improve A/ROM in right hand ring finger total active motion to at least 200*, to have functional motion for tasks like reach and grasp.  Goal status: INITIAL  4.  Pt will improve strength in right wrist flexion/extension from apparent 3 -/5 MMT to at least 4+/5 MMT to have increased functional ability to carry out selfcare and higher-level homecare tasks with less difficulty. Goal status:  INITIAL  5.  Pt will improve coordination skills in right hand, as seen by within functional limit score on box and blocks testing to have increased functional ability to carry out fine motor tasks (fasteners, etc.) and more complex, coordinated IADLs (meal prep, sports, etc.).  Goal status: INITIAL  6.  Pt will decrease pain at rest from 4/10 to 1/10 or better to have better sleep and occupational participation in daily roles. Goal status: INITIAL   ASSESSMENT:  CLINICAL IMPRESSION: 05/08/24: ***    05/03/24: He has been managing fairly well over the past 3 weeks himself, though his wrist is a bit stiff from being in the long arm orthosis too much.  We have a newly modified orthosis today, plenty of new stretches and activities to perform which he tolerated well.  Eval: Patient is a 34 y.o. male who was seen today for occupational therapy evaluation for stiffness, weakness, decreased coordination, strength and decreased functional ability in the right hand and wrist as a result of fracture, subsequent percutaneous pinning, etc.  The patient will benefit from outpatient occupational therapy to decrease symptoms, improve functional upper extremity use, and increase quality of life.     PLAN:  OT FREQUENCY: 1-2x/week  OT DURATION: 6 weeks through 05/24/24 and up to 7 total visits as needed   PLANNED INTERVENTIONS: 97535 self care/ADL training, 02889 therapeutic exercise, 97530 therapeutic activity, 97112 neuromuscular re-education, 97140 manual therapy, 97035 ultrasound, 97032 electrical stimulation (manual), 97760 Orthotic Initial, [id number] Orthotic/Prosthetic subsequent, compression bandaging, Dry needling, energy conservation, coping strategies training, and patient/family education  CONSULTED AND AGREED WITH PLAN OF CARE: Patient  PLAN FOR NEXT SESSION:  ***  Melvenia Ada, OTR/L, CHT  05/07/2024, 8:45 AM   "

## 2024-05-08 ENCOUNTER — Telehealth: Payer: Self-pay | Admitting: Rehabilitative and Restorative Service Providers"

## 2024-05-08 ENCOUNTER — Encounter: Admitting: Rehabilitative and Restorative Service Providers"

## 2024-05-08 NOTE — Telephone Encounter (Signed)
 OT called patient to discuss missed appointment. OT spoke with pt about next appointment date/time. He had transportation issues. He was asked to call in if he cannot make his appointments.  Future missed appointments could lead to early discharge from therapy.

## 2024-05-13 NOTE — Therapy (Signed)
 " OUTPATIENT OCCUPATIONAL THERAPY TREATMENT NOTE  Patient Name: Hayden Moore MRN: [id number] DOB:06-Aug-1990, 34 y.o., male Today's Date: 05/15/2024  PCP: Tanda FELIX MD  REFERRING PROVIDER: Dr. Arlinda   END OF SESSION:  OT End of Session - 05/15/24 0938     Visit Number 3    Number of Visits 7    Date for Recertification  05/24/24    Authorization Type [employer]    OT Start Time [phone]    OT Stop Time 1016    OT Time Calculation (min) 38 min    Equipment Utilized During Treatment Green therapy band and therapy putty    Activity Tolerance Patient tolerated treatment well;No increased pain;Patient limited by fatigue    Behavior During Therapy [facility] for tasks assessed/performed            Past Medical History:  Diagnosis Date   Arthritis    GERD (gastroesophageal reflux disease)    Hypertension    Past Surgical History:  Procedure Laterality Date   EMBOLECTOMY Right 01/23/2023   Procedure: ARM BRACHIAL EMBOLECTOMY;  Surgeon: Lanis Fonda BRAVO, MD;  Location: [facility] OR;  Service: Vascular;  Laterality: Right;   OPEN REDUCTION INTERNAL FIXATION (ORIF) METACARPAL Left 03/15/2024   Procedure: Left hand fourth metacarpal closed  reduction  percutaneous pinning;  Surgeon: Arlinda Buster, MD;  Location: [facility];  Service: Orthopedics;  Laterality: Left;   Patient Active Problem List   Diagnosis Date Noted   Closed left hand fracture 03/29/2024   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   TOBACCO USER 03/17/2009   Dermatophytosis of body 03/16/2009   PENILE PAIN 02/10/2009   OBESITY 04/09/2008   TINEA VERSICOLOR 02/12/2007   HYPERTENSION, BENIGN SYSTEMIC 06/22/2006    ONSET DATE: DOS 03/15/24  REFERRING DIAG: D37.07KJ (ICD-10-CM) - Closed fracture of left hand, initial encounter   THERAPY DIAG:  Localized edema  Muscle weakness (generalized)  Pain in right hand  Stiffness of right hand, not elsewhere classified  Other lack of  coordination  Rationale for Evaluation and Treatment: Rehabilitation  PERTINENT HISTORY: None other related to this injury Today he showed up quite late for evaluation because he also showed up late for his doctor's appointment, so evaluation was somewhat rushed and limited today.  4 weeks s/p right 4th metacarpal fx pinning. 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  He states he is having some pain after the pin has been removed recently.  He is a bit swollen and sore.   PRECAUTIONS: None  RED FLAGS: None   WEIGHT BEARING RESTRICTIONS: Yes nonweightbearing in right hand   SUBJECTIVE:   SUBJECTIVE STATEMENT: 9 weeks s/p right 4th metacarpal fx pinning. He states feeling a lot better than last week and not having any pain at rest.      PAIN:  Are you having pain? 0/10 at rest now, sore when making fist    PATIENT GOALS: To safely improve the use of his right hand and arm for daily and functional ability  NEXT MD VISIT: Approximately 2 to 4 weeks   OBJECTIVE: (All objective assessments below are from initial evaluation on: 04/10/24 unless otherwise specified.)   HAND DOMINANCE: Right   ADLs: Overall ADLs: States decreased ability to grab, hold household objects, pain and difficulty to open containers, perform FMS tasks (manipulate fasteners on clothing), etc.   FUNCTIONAL OUTCOME MEASURES: 05/15/24: Patient Specific Functional Scale: 4.3 (Holding heavy grocery  bags, turning heavy doorknobs ) (Higher Score  =  Better Ability for the Selected Tasks)      UPPER EXTREMITY ROM     Shoulder to Wrist AROM Right eval Rt 05/03/24 Rt 05/15/24  Elbow flexion WNL    Elbow extension WNL    Forearm supination ~75    Forearm pronation  ~85    Wrist flexion 66 53 59  Wrist extension 55 45 60  (Blank rows = not tested)   Hand AROM Right eval Rt 05/03/24 Rt 05/15/24  Full Fist Ability (or Gap to Distal Palmar Crease) Unable to  due to pain and stiffness in the ring finger Almost make a full fist   Thumb Opposition  (Kapandji Scale)  4/10 6/10   Ring MCP (0-90) TBD  0- 43 0- 61  Ring PIP (0-100)   0- 105 0- 108  Ring DIP (0-70)   0- 45 0- 45  (Blank rows = not tested)   HAND FUNCTION: 05/15/24: Lt grip: 45.7 #  05/03/24: Grip strength Right: 86lbs, Left: 33lbs   COORDINATION: Eval: Observed coordination impairments with affected Rt hand.  Details TBD in the next session as time allows Box and Blocks Test: Rt: TBD Blocks today (TBD is [facility])   SENSATION: Eval:  Light touch intact today, though diminished around sx area    EDEMA:   Eval:  Mildly swollen in rt hand and wrist today, compared to the left hand  OBSERVATIONS:   Eval: Pin removal site is still bleeding somewhat, but OT helps that get under control.  Hand is tender and swollen, he has some pain and apprehension trying to bend at the MCP joint, but wrist motion is not overly painful today   Right hand fourth metacarpal fracture and CRPP  TODAY'S TREATMENT:  05/15/24:  He starts with AROM for exercise as well as new measures Showing excellent improvements.  He is also less tender to touch, having no pain at rest.  OT upgrades his home exercise program to include diet to include dynamic therapy putty strengthening as well as forearm and wrist wrist strengthening as listed below.  Each of these were reviewed and done with him to ensure he understands and that they do not cause pain.  He does well with them I will, states understanding that next visit may be his last if he meets all of his therapy goals.  We also briefly discussed his home activities and he is only having issues with heavier tasks and turning doorknobs.  New strengthening and activity should help with this       Exercises - Wrist Flexion Stretch  - 4 x daily - 3-5 reps - 15 sec hold - Wrist Prayer Stretch  - 4 x daily - 3-5 reps - 15 sec hold - BACK KNUCKLE STRETCHES   - 4 x daily - 3-5  reps - 15 sec hold - HOOK Stretch  - 4 x daily - 3-5 reps - 15-20 sec hold - Seated Finger Composite Flexion Stretch  - 4 x daily - 3-5 reps - 15 hold - Tendon Glides  - 4-6 x daily - 3-5 reps - 2-3 seconds hold - Standing Bicep Curls with Resistance  - 2-4 x daily - 1-2 sets - 10-15 reps - Hammer Stretch or Strength   - 2-4 x daily - 1-2 sets - 10-15 reps - Wrist Extension with Resistance  - 2-4 x daily - 1-2 sets - 10-15 reps - Wrist Flexion with Resistance  -  2-4 x daily - 1-2 sets - 10-15 reps - Full Fist  - 2-3 x daily - 5 reps - Thumb Opposition with Putty  - 2-3 x daily - 5 reps - Cutting Putty  - 2-3 x daily - 5 reps - Finger Spread  - 2-3 x daily - 5 reps     05/03/24: He performs active range of motion at the wrist and hand, as well as gripping today for new baseline measures as well as exercises.  As he has not been seen for about 3 weeks, there are several new exercises and activities that he can begin per protocols and according to his current status.  These things are bolded below, and each 1 was shown to him and explained and demonstrated so as to not cause pain, and be the most effective.  He states understanding stretches, and does them in a way that limits pain.  Additionally, OT adjusted his orthosis to become a hand-based MP blocking orthosis to allow better wrist and forearm motion at all times.  We also discussed safety and self-care and weaning from the orthosis for light tasks that way no more than 1 or 2 pounds.    Also, OT educates on light isometric grip training that can be done.  He does tolerate it well with some pain, and also tolerates pinching with slight pain that resolves when he is finished.  He is encouraged to use ice as needed to relieve soreness and pain.  Exercises/activities reviewed and performed today (bolded are new) - Wrist Flexion Stretch  - 4 x daily - 3-5 reps - 15 sec hold - Wrist Prayer Stretch  - 4 x daily - 3-5 reps - 15 sec hold - BACK  KNUCKLE STRETCHES   - 4 x daily - 3-5 reps - 15 sec hold - HOOK Stretch  - 4 x daily - 3-5 reps - 15-20 sec hold - Seated Finger Composite Flexion Stretch  - 4 x daily - 3-5 reps - 15 hold - Thumb stretch  - 4 x daily - 3-5 reps - 15-20 sec hold - Bend and Pull Back Wrist SLOWLY  - 4 x daily - 10-15 reps - Tendon Glides  - 4-6 x daily - 3-5 reps - 2-3 seconds hold - Thumb Opposition  - 4-6 x daily - 10 reps - Towel Roll Grip with Forearm in Neutral  - 3 x daily - 5 reps - 10 sec hold   PATIENT EDUCATION: Education details: See tx section above for details  Person educated: Patient Education method: Engineer, Structural, Teach back, Handouts  Education comprehension: States and demonstrates understanding, Additional Education required    HOME EXERCISE PROGRAM: Access Code: [email or web address] URL: [email or web address] Date: 05/03/2024 Prepared by: Melvenia Ada   GOALS: Goals reviewed with patient? Yes   SHORT TERM GOALS: (STG required if POC>30 days) Target Date: 04/26/24  Pt will obtain protective, custom orthotic. Goal status: 04/10/24:  MET   2.  Pt will demo/state understanding of initial HEP to improve pain levels and prerequisite motion. Goal status: 05/03/2024: Goal met   LONG TERM GOALS: Target Date: 05/24/24  Pt will improve functional ability by decreased impairment per PSFS assessment from TBD to TBD or better, for better quality of life. Goal status: INITIAL (pending baseline measures)  2.  Pt will improve grip strength in right hand from unsafe to test to at least 40lbs for functional use at home and in IADLs. Goal status: INITIAL  3.  Pt will improve  A/ROM in right hand ring finger total active motion to at least 200*, to have functional motion for tasks like reach and grasp.  Goal status: INITIAL  4.  Pt will improve strength in right wrist flexion/extension from apparent 3 -/5 MMT to at least 4+/5 MMT to have increased functional ability to carry out  selfcare and higher-level homecare tasks with less difficulty. Goal status: INITIAL  5.  Pt will improve coordination skills in right hand, as seen by within functional limit score on box and blocks testing to have increased functional ability to carry out fine motor tasks (fasteners, etc.) and more complex, coordinated IADLs (meal prep, sports, etc.).  Goal status: INITIAL  6.  Pt will decrease pain at rest from 4/10 to 1/10 or better to have better sleep and occupational participation in daily roles. Goal status: INITIAL   ASSESSMENT:  CLINICAL IMPRESSION: 05/15/24: Total active motion is motion is now approaching 220 degrees which is very good.  Strengthening is going well   05/03/24: He has been managing fairly well over the past 3 weeks himself, though his wrist is a bit stiff from being in the long arm orthosis too much.  We have a newly modified orthosis today, plenty of new stretches and activities to perform which he tolerated well.  Eval: Patient is a 34 y.o. male who was seen today for occupational therapy evaluation for stiffness, weakness, decreased coordination, strength and decreased functional ability in the right hand and wrist as a result of fracture, subsequent percutaneous pinning, etc.  The patient will benefit from outpatient occupational therapy to decrease symptoms, improve functional upper extremity use, and increase quality of life.     PLAN:  OT FREQUENCY: 1-2x/week  OT DURATION: 6 weeks through 05/24/24 and up to 7 total visits as needed   PLANNED INTERVENTIONS: 97535 self care/ADL training, 02889 therapeutic exercise, 97530 therapeutic activity, 97112 neuromuscular re-education, 97140 manual therapy, 97035 ultrasound, 97032 electrical stimulation (manual), 97760 Orthotic Initial, [id number] Orthotic/Prosthetic subsequent, compression bandaging, Dry needling, energy conservation, coping strategies training, and patient/family education  CONSULTED AND AGREED WITH PLAN  OF CARE: Patient  PLAN FOR NEXT SESSION:  Review putty and strengthening, consider discharge if all goals are met  Melvenia Ada, OTR/L, CHT  05/15/2024, 10:19 AM   "

## 2024-05-15 ENCOUNTER — Ambulatory Visit: Payer: Self-pay | Admitting: Rehabilitative and Restorative Service Providers"

## 2024-05-15 ENCOUNTER — Encounter: Payer: Self-pay | Admitting: Rehabilitative and Restorative Service Providers"

## 2024-05-15 DIAGNOSIS — M79641 Pain in right hand: Secondary | ICD-10-CM

## 2024-05-15 DIAGNOSIS — R600 Localized edema: Secondary | ICD-10-CM

## 2024-05-15 DIAGNOSIS — R278 Other lack of coordination: Secondary | ICD-10-CM

## 2024-05-15 DIAGNOSIS — M6281 Muscle weakness (generalized): Secondary | ICD-10-CM

## 2024-05-15 DIAGNOSIS — M25641 Stiffness of right hand, not elsewhere classified: Secondary | ICD-10-CM

## 2024-05-21 NOTE — Progress Notes (Unsigned)
" ° °  Hayden Moore - 34 y.o. male MRN [id number]  Date of birth: February 15, 1991  Office Visit Note: Visit Date: 05/22/2024 PCP: Tanda Bleacher, MD Referred by: Tanda Bleacher, MD  Subjective:  HPI: Hayden Moore is a 34 y.o. male who presents today for follow up 9 weeks status post left hand ring finger metacarpal fracture closed reduction and percutaneous pinning.  Pertinent ROS were reviewed with the patient and found to be negative unless otherwise specified above in HPI.   Assessment & Plan: Visit Diagnoses: No diagnosis found.  Plan: ***  Follow-up: No follow-ups on file.   Meds & Orders: No orders of the defined types were placed in this encounter.  No orders of the defined types were placed in this encounter.    Procedures: No procedures performed       Objective:   Vital Signs: There were no vitals taken for this visit.  Ortho Exam ***  Imaging: No results found.   Anshul Afton Alderton, M.D. [facility], Hand Surgery  "

## 2024-05-21 NOTE — Therapy (Incomplete)
 " OUTPATIENT OCCUPATIONAL THERAPY TREATMENT & *** NOTE  Patient Name: Hayden Moore MRN: [id number] DOB:1990-05-07, 34 y.o., male Today's Date: 05/21/2024  PCP: Tanda FELIX MD  REFERRING PROVIDER: Dr. Arlinda                 ***           END OF SESSION:      Past Medical History:  Diagnosis Date   Arthritis    GERD (gastroesophageal reflux disease)    Hypertension    Past Surgical History:  Procedure Laterality Date   EMBOLECTOMY Right 01/23/2023   Procedure: ARM BRACHIAL EMBOLECTOMY;  Surgeon: Lanis Fonda BRAVO, MD;  Location: [facility] OR;  Service: Vascular;  Laterality: Right;   OPEN REDUCTION INTERNAL FIXATION (ORIF) METACARPAL Left 03/15/2024   Procedure: Left hand fourth metacarpal closed  reduction  percutaneous pinning;  Surgeon: Arlinda Buster, MD;  Location: [facility];  Service: Orthopedics;  Laterality: Left;   Patient Active Problem List   Diagnosis Date Noted   Closed left hand fracture 03/29/2024   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  Rationale for Evaluation and Treatment: Rehabilitation  PERTINENT HISTORY: None other related to this injury Today he showed up quite late for evaluation because he also showed up late for his doctor's appointment, so evaluation was somewhat rushed and limited today.  4 weeks s/p right 4th metacarpal fx pinning. 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  He states he is having some pain after the pin has been removed recently.  He is a bit swollen and sore.   PRECAUTIONS: None  RED  FLAGS: None   WEIGHT BEARING RESTRICTIONS: Yes nonweightbearing in right hand   SUBJECTIVE:   SUBJECTIVE STATEMENT: 10  weeks s/p right 4th metacarpal fx pinning. He states ***     PAIN:  Are you having pain? 0/10 at rest now, sore when making fist    PATIENT GOALS: To safely improve the use of his right hand and arm for daily and functional ability  NEXT MD VISIT: Approximately 2 to 4 weeks   OBJECTIVE: (All objective assessments below are from initial evaluation on: 04/10/24 unless otherwise specified.)   HAND DOMINANCE: Right   ADLs: Overall ADLs: States decreased ability to grab, hold household objects, pain and difficulty to open containers, perform FMS tasks (manipulate fasteners on clothing), etc.   FUNCTIONAL OUTCOME MEASURES: 05/15/24: Patient Specific Functional Scale: 4.3 (Holding heavy grocery bags, turning heavy doorknobs ) (Higher Score  =  Better Ability for the Selected Tasks)      UPPER EXTREMITY ROM     Shoulder to Wrist AROM Right eval Rt 05/03/24 Rt 05/15/24  Elbow flexion WNL    Elbow extension WNL    Forearm supination ~75    Forearm pronation  ~85    Wrist flexion 66 53 59  Wrist extension 55 45 60  (Blank rows = not tested)   Hand AROM Right eval Rt 05/03/24 Rt 05/15/24 Rt 05/22/24  Full Fist Ability (or Gap to Distal Palmar Crease) Unable to due to pain and  stiffness in the ring finger Almost make a full fist    Thumb Opposition  (Kapandji Scale)  4/10 6/10    Ring MCP (0-90) TBD  0- 43 0- 61 0 - ***  Ring PIP (0-100)   0- 105 0- 108 0 - ***  Ring DIP (0-70)   0- 45 0- 45 0 - ***  (Blank rows = not tested)   HAND FUNCTION: 05/22/24: Rt grip : ***#    05/15/24: Rt grip: 45.7 #  05/03/24: Grip strength Right: 86lbs, Left: 33lbs   COORDINATION: Eval: Observed coordination impairments with affected Rt hand.  Details TBD in the next session as time allows Box and Blocks Test: Rt: TBD Blocks today (TBD is [facility])   OBSERVATIONS:   Eval:  Pin removal site is still bleeding somewhat, but OT helps that get under control.  Hand is tender and swollen, he has some pain and apprehension trying to bend at the MCP joint, but wrist motion is not overly painful today   Right hand fourth metacarpal fracture and CRPP  TODAY'S TREATMENT:  05/22/24: ***  Review putty and strengthening, consider discharge if all goals are met   05/15/24:  He starts with AROM for exercise as well as new measures Showing excellent improvements.  He is also less tender to touch, having no pain at rest.  OT upgrades his home exercise program to include diet to include dynamic therapy putty strengthening as well as forearm and wrist wrist strengthening as listed below.  Each of these were reviewed and done with him to ensure he understands and that they do not cause pain.  He does well with them I will, states understanding that next visit may be his last if he meets all of his therapy goals.  We also briefly discussed his home activities and he is only having issues with heavier tasks and turning doorknobs.  New strengthening and activity should help with this  Exercises - Wrist Flexion Stretch  - 4 x daily - 3-5 reps - 15 sec hold - Wrist Prayer Stretch  - 4 x daily - 3-5 reps - 15 sec hold - BACK KNUCKLE STRETCHES   - 4 x daily - 3-5 reps - 15 sec hold - HOOK Stretch  - 4 x daily - 3-5 reps - 15-20 sec hold - Seated Finger Composite Flexion Stretch  - 4 x daily - 3-5 reps - 15 hold - Tendon Glides  - 4-6 x daily - 3-5 reps - 2-3 seconds hold - Standing Bicep Curls with Resistance  - 2-4 x daily - 1-2 sets - 10-15 reps - Hammer Stretch or Strength   - 2-4 x daily - 1-2 sets - 10-15 reps - Wrist Extension with Resistance  - 2-4 x daily - 1-2 sets - 10-15 reps - Wrist Flexion with Resistance  - 2-4 x daily - 1-2 sets - 10-15 reps - Full Fist  - 2-3 x daily - 5 reps - Thumb Opposition with Putty  - 2-3 x daily - 5 reps - Cutting Putty  - 2-3 x daily - 5 reps -  Finger Spread  - 2-3 x daily - 5 reps   PATIENT EDUCATION: Education details: See tx section above for details  Person educated: Patient Education method: Verbal Instruction, Teach back, Handouts  Education comprehension: States and demonstrates understanding, Additional Education required    HOME EXERCISE PROGRAM: Access Code: [email or web address] URL: [email or web address] Date: 05/03/2024 Prepared by: Melvenia Ada   GOALS: Goals reviewed with  patient? Yes   SHORT TERM GOALS: (STG required if POC>30 days) Target Date: 04/26/24  Pt will obtain protective, custom orthotic. Goal status: 04/10/24:  MET   2.  Pt will demo/state understanding of initial HEP to improve pain levels and prerequisite motion. Goal status: 05/03/2024: Goal met   LONG TERM GOALS: Target Date: 05/24/24  Pt will improve functional ability by decreased impairment per PSFS assessment from 4.3 to 6.5 or better, for better quality of life. Goal status: 05/22/24: ***    2.  Pt will improve grip strength in right hand from unsafe to test to at least 40lbs for functional use at home and in IADLs. Goal status: 05/22/24: ***    3.  Pt will improve A/ROM in right hand ring finger total active motion to at least 200*, to have functional motion for tasks like reach and grasp.  Goal status: 05/22/24: ***    4.  Pt will improve strength in right wrist flexion/extension from apparent 3 -/5 MMT to at least 4+/5 MMT to have increased functional ability to carry out selfcare and higher-level homecare tasks with less difficulty. Goal status: 05/22/24: ***    5.  Pt will improve coordination skills in right hand, as seen by within functional limit score on box and blocks testing to have increased functional ability to carry out fine motor tasks (fasteners, etc.) and more complex, coordinated IADLs (meal prep, sports, etc.).  Goal status: 05/22/24: ***    6.  Pt will decrease pain at rest from 4/10 to 1/10 or better to  have better sleep and occupational participation in daily roles. Goal status: 05/22/24: ***     ASSESSMENT:  CLINICAL IMPRESSION: 05/22/24: ***   05/15/24: Total active motion is motion is now approaching 220 degrees which is very good.  Strengthening is going well    PLAN:  OT FREQUENCY: 1-2x/week  OT DURATION: 6 weeks through 05/24/24 and up to 7 total visits as needed   PLANNED INTERVENTIONS: 97535 self care/ADL training, [id number] therapeutic exercise, 97530 therapeutic activity, 97112 neuromuscular re-education, 97140 manual therapy, 97035 ultrasound, 97032 electrical stimulation (manual), 97760 Orthotic Initial, [id number] Orthotic/Prosthetic subsequent, compression bandaging, Dry needling, energy conservation, coping strategies training, and patient/family education  CONSULTED AND AGREED WITH PLAN OF CARE: Patient  PLAN FOR NEXT SESSION:  ***  Melvenia Ada, OTR/L, CHT  05/21/2024, 4:50 PM   "

## 2024-05-22 ENCOUNTER — Encounter: Admitting: Rehabilitative and Restorative Service Providers"

## 2024-05-22 ENCOUNTER — Encounter: Payer: Self-pay | Admitting: Orthopedic Surgery
